# Patient Record
Sex: Male | Born: 1980 | Race: Black or African American | Hispanic: No | Marital: Single | Smoking: Current every day smoker
Health system: Southern US, Community
[De-identification: ages and names within clinical notes are randomized; demographics above are authoritative.]

## PROBLEM LIST (undated history)

## (undated) DIAGNOSIS — K5900 Constipation, unspecified: Secondary | ICD-10-CM

## (undated) DIAGNOSIS — N35919 Unspecified urethral stricture, male, unspecified site: Secondary | ICD-10-CM

## (undated) DIAGNOSIS — Z435 Encounter for attention to cystostomy: Secondary | ICD-10-CM

## (undated) DIAGNOSIS — Z978 Presence of other specified devices: Secondary | ICD-10-CM

## (undated) DIAGNOSIS — Z8619 Personal history of other infectious and parasitic diseases: Secondary | ICD-10-CM

## (undated) DIAGNOSIS — D649 Anemia, unspecified: Secondary | ICD-10-CM

---

## 2002-12-31 ENCOUNTER — Emergency Department (HOSPITAL_COMMUNITY): Admission: EM | Admit: 2002-12-31 | Discharge: 2002-12-31 | Payer: Self-pay | Admitting: Emergency Medicine

## 2002-12-31 ENCOUNTER — Encounter: Payer: Self-pay | Admitting: Emergency Medicine

## 2003-01-05 ENCOUNTER — Emergency Department (HOSPITAL_COMMUNITY): Admission: EM | Admit: 2003-01-05 | Discharge: 2003-01-05 | Payer: Self-pay | Admitting: Emergency Medicine

## 2004-02-14 ENCOUNTER — Emergency Department (HOSPITAL_COMMUNITY): Admission: EM | Admit: 2004-02-14 | Discharge: 2004-02-14 | Payer: Self-pay | Admitting: Emergency Medicine

## 2009-08-16 ENCOUNTER — Emergency Department (HOSPITAL_COMMUNITY): Admission: EM | Admit: 2009-08-16 | Discharge: 2009-08-16 | Payer: Self-pay | Admitting: Emergency Medicine

## 2009-10-08 ENCOUNTER — Emergency Department (HOSPITAL_COMMUNITY): Admission: EM | Admit: 2009-10-08 | Discharge: 2009-10-08 | Payer: Self-pay | Admitting: Emergency Medicine

## 2009-10-10 ENCOUNTER — Emergency Department (HOSPITAL_COMMUNITY): Admission: EM | Admit: 2009-10-10 | Discharge: 2009-10-10 | Payer: Self-pay | Admitting: Emergency Medicine

## 2009-10-19 ENCOUNTER — Emergency Department (HOSPITAL_COMMUNITY): Admission: EM | Admit: 2009-10-19 | Discharge: 2009-10-19 | Payer: Self-pay | Admitting: Emergency Medicine

## 2017-04-23 ENCOUNTER — Encounter (HOSPITAL_COMMUNITY): Payer: Self-pay | Admitting: Emergency Medicine

## 2017-04-23 ENCOUNTER — Other Ambulatory Visit: Payer: Self-pay

## 2017-04-23 DIAGNOSIS — Y999 Unspecified external cause status: Secondary | ICD-10-CM | POA: Insufficient documentation

## 2017-04-23 DIAGNOSIS — S025XXA Fracture of tooth (traumatic), initial encounter for closed fracture: Secondary | ICD-10-CM | POA: Insufficient documentation

## 2017-04-23 DIAGNOSIS — Y92019 Unspecified place in single-family (private) house as the place of occurrence of the external cause: Secondary | ICD-10-CM | POA: Insufficient documentation

## 2017-04-23 DIAGNOSIS — F17210 Nicotine dependence, cigarettes, uncomplicated: Secondary | ICD-10-CM | POA: Insufficient documentation

## 2017-04-23 DIAGNOSIS — S0181XA Laceration without foreign body of other part of head, initial encounter: Secondary | ICD-10-CM | POA: Insufficient documentation

## 2017-04-23 DIAGNOSIS — Y939 Activity, unspecified: Secondary | ICD-10-CM | POA: Insufficient documentation

## 2017-04-23 DIAGNOSIS — W010XXA Fall on same level from slipping, tripping and stumbling without subsequent striking against object, initial encounter: Secondary | ICD-10-CM | POA: Insufficient documentation

## 2017-04-23 LAB — CBC
HCT: 48.2 % (ref 39.0–52.0)
Hemoglobin: 16.2 g/dL (ref 13.0–17.0)
MCH: 32.2 pg (ref 26.0–34.0)
MCHC: 33.6 g/dL (ref 30.0–36.0)
MCV: 95.8 fL (ref 78.0–100.0)
Platelets: 276 10*3/uL (ref 150–400)
RBC: 5.03 MIL/uL (ref 4.22–5.81)
RDW: 13.5 % (ref 11.5–15.5)
WBC: 8.6 10*3/uL (ref 4.0–10.5)

## 2017-04-23 LAB — BASIC METABOLIC PANEL
ANION GAP: 12 (ref 5–15)
BUN: 10 mg/dL (ref 6–20)
CHLORIDE: 103 mmol/L (ref 101–111)
CO2: 24 mmol/L (ref 22–32)
Calcium: 9.2 mg/dL (ref 8.9–10.3)
Creatinine, Ser: 0.85 mg/dL (ref 0.61–1.24)
GFR calc non Af Amer: >60 mL/min (ref >60)
Glucose, Bld: 87 mg/dL (ref 65–99)
Potassium: 3.8 mmol/L (ref 3.5–5.1)
Sodium: 139 mmol/L (ref 135–145)

## 2017-04-23 LAB — URINALYSIS, ROUTINE W REFLEX MICROSCOPIC
Bilirubin Urine: NEGATIVE
GLUCOSE, UA: NEGATIVE mg/dL
Hgb urine dipstick: NEGATIVE
KETONES UR: NEGATIVE mg/dL
Nitrite: NEGATIVE
PROTEIN: NEGATIVE mg/dL
Specific Gravity, Urine: 1.028 (ref 1.005–1.030)
pH: 6 (ref 5.0–8.0)

## 2017-04-23 NOTE — ED Triage Notes (Signed)
Pt states he had a syncopal episode yesterday, states he does not remember what happened but that he woke up and had a chipped tooth and a laceration under his chin. approx 3.5in laceration present under chin bleeding since yesterday but controlled.

## 2017-04-23 NOTE — ED Triage Notes (Signed)
Also c/o of a burning sensation to his hands. Unknown origin.

## 2017-04-24 ENCOUNTER — Emergency Department (HOSPITAL_COMMUNITY)
Admission: EM | Admit: 2017-04-24 | Discharge: 2017-04-24 | Disposition: A | Discharge status: Home / Self Care | Payer: Self-pay | Attending: Emergency Medicine | Admitting: Emergency Medicine

## 2017-04-24 DIAGNOSIS — S0993XA Unspecified injury of face, initial encounter: Secondary | ICD-10-CM

## 2017-04-24 DIAGNOSIS — S0181XA Laceration without foreign body of other part of head, initial encounter: Secondary | ICD-10-CM

## 2017-04-24 DIAGNOSIS — R55 Syncope and collapse: Secondary | ICD-10-CM

## 2017-04-24 MED ORDER — LIDOCAINE-EPINEPHRINE (PF) 2 %-1:200000 IJ SOLN
10.0000 mL | Freq: Once | INTRAMUSCULAR | Status: AC
  Administered 2017-04-24: 10 mL via INTRADERMAL
  Filled 2017-04-24: qty 20

## 2017-04-24 NOTE — ED Provider Notes (Signed)
[facility] EMERGENCY DEPARTMENT Provider Note   CSN: [id number] Arrival date & time: 04/23/17  1921     History   Chief Complaint Chief Complaint  Patient presents with  . Loss of Consciousness  . Facial Injury    HPI Jason Oconnell is a 37 y.o. male.  HPI Jason Oconnell is a 37 y.o. male with no medical problems, presents to ED with complaint of syncopal episodes and laceration to the chin.  Patient states he was walking in his house last night, states he got up "too quickly" and was walking to the bathroom when he got dizzy, lightheaded, and fell forward striking his chin on the floor.  He reports witnessed loss of consciousness for several seconds by his girlfriend.  Patient reports he chipped several teeth, and sustained laceration to the chin.  Pressure was applied for bleeding.  He reports large amount of bleeding.  Patient waiting room for 10 hours, states he has been feeling well since the syncopal episode.  Denies any dizziness or lightheadedness.  Denies any headache.  No nausea or vomiting.  No changes in his vision.  No memory loss.  No confusion.  States tetanus is within the last 10 years.  History reviewed. No pertinent past medical history.  There are no active problems to display for this patient.   History reviewed. No pertinent surgical history.     Home Medications    Prior to Admission medications   Not on File    Family History No family history on file.  Social History Social History   Tobacco Use  . Smoking status: Current Every Day Smoker    Packs/day: 1.00    Types: Cigarettes  . Smokeless tobacco: Never Used  Substance Use Topics  . Alcohol use: No    Frequency: Never  . Drug use: Yes    Frequency: 5.0 times per week    Types: Marijuana     Allergies   Patient has no known allergies.   Review of Systems Review of Systems  Constitutional: Negative for chills and fever.  Respiratory: Negative for cough,  chest tightness and shortness of breath.   Cardiovascular: Negative for chest pain, palpitations and leg swelling.  Gastrointestinal: Negative for abdominal distention, abdominal pain, diarrhea, nausea and vomiting.  Genitourinary: Negative for dysuria, frequency, hematuria and urgency.  Musculoskeletal: Negative for arthralgias, myalgias, neck pain and neck stiffness.  Skin: Positive for wound. Negative for rash.  Allergic/Immunologic: Negative for immunocompromised state.  Neurological: Positive for syncope. Negative for dizziness, weakness, light-headedness, numbness and headaches.  All other systems reviewed and are negative.    Physical Exam Updated Vital Signs BP 139/75   Pulse (!) 54   Temp 98.6 F (37 C) (Oral)   Resp 16   Ht 6' (1.829 m)   Wt 90.7 kg (200 lb)   SpO2 100%   BMI 27.12 kg/m   Physical Exam  Constitutional: He is oriented to person, place, and time. He appears well-developed and well-nourished. No distress.  HENT:  Head: Normocephalic and atraumatic.  4 cm laceration to the chin, gaping.  Several chipped front teeth.  No tooth loosening.  No tenderness to palpation over mandible or maxilla bones.  No trismus  Eyes: Conjunctivae and EOM are normal. Pupils are equal, round, and reactive to light.  Neck: Neck supple.  No midline tenderness.  Cardiovascular: Normal rate, regular rhythm and normal heart sounds.  Pulmonary/Chest: Effort normal. No respiratory distress. He has no wheezes.  He has no rales.  Abdominal: Soft. Bowel sounds are normal. He exhibits no distension. There is no tenderness. There is no rebound.  Musculoskeletal: He exhibits no edema.  Neurological: He is alert and oriented to person, place, and time.  5/5 and equal upper and lower extremity strength bilaterally. Equal grip strength bilaterally. Normal finger to nose and heel to shin. No pronator drift. Gait is normal  Skin: Skin is warm and dry.  Nursing note and vitals  reviewed.    ED Treatments / Results  Labs (all labs ordered are listed, but only abnormal results are displayed) Labs Reviewed  URINALYSIS, ROUTINE W REFLEX MICROSCOPIC - Abnormal; Notable for the following components:      Result Value   APPearance HAZY (*)    Leukocytes, UA SMALL (*)    Bacteria, UA RARE (*)    Squamous Epithelial / LPF 0-5 (*)    All other components within normal limits  BASIC METABOLIC PANEL  CBC  CBG MONITORING, ED    EKG  EKG Interpretation  Date/Time:  Wednesday April 23 2017 21:01:56 EST Ventricular Rate:  59 PR Interval:  152 QRS Duration: 90 QT Interval:  392 QTC Calculation: 388 R Axis:   40 Text Interpretation:  Sinus bradycardia Nonspecific ST abnormality Abnormal ECG Confirmed by Tilden Fossaees, Elizabeth [phone]([id number]) on 04/24/2017 6:32:05 AM       Radiology No results found.  Procedures .Marland Kitchen.Laceration Repair Date/Time: 04/24/2017 7:49 AM Performed by: Jaynie CrumbleKirichenko, Kaelie Henigan, PA-C Authorized by: Jaynie CrumbleKirichenko, Terilyn Sano, PA-C   Consent:    Consent obtained:  Verbal   Consent given by:  Patient   Risks discussed:  Infection, pain, poor cosmetic result and need for additional repair   Alternatives discussed:  No treatment and delayed treatment Anesthesia (see MAR for exact dosages):    Anesthesia method:  Local infiltration   Local anesthetic:  Lidocaine 2% WITH epi Laceration details:    Location:  Face   Face location:  Chin   Length (cm):  4 Repair type:    Repair type:  Simple Exploration:    Wound exploration: wound explored through full range of motion   Treatment:    Area cleansed with:  Saline   Amount of cleaning:  Standard   Irrigation solution:  Sterile saline   Irrigation method:  Syringe Skin repair:    Repair method:  Sutures   Suture size:  6-0   Suture material:  Prolene   Suture technique:  Simple interrupted   Number of sutures:  7 Approximation:    Approximation:  Close   Vermilion border: well-aligned   Post-procedure  details:    Patient tolerance of procedure:  Tolerated well, no immediate complications   (including critical care time)  Medications Ordered in ED Medications  lidocaine-EPINEPHrine (XYLOCAINE W/EPI) 2 %-1:200000 (PF) injection 10 mL (not administered)     Initial Impression / Assessment and Plan / ED Course  I have reviewed the triage vital signs and the nursing notes.  Pertinent labs & imaging results that were available during my care of the patient were reviewed by me and considered in my medical decision making (see chart for details).     Pt in ED with a syncopal episode.  Syncopal episode after standing up quickly.  He has not had any dizziness or lightheadedness.  He has normal neurological exam.  He has no cervical spine tenderness.  No headache.  Doubt intracranial bleed or cervical spine injury.  No tenderness over facial bones.  He does  have several chipped teeth, he will follow-up with a dentist.  There is no tooth loosening.  Laceration was repaired with sutures.  Basic labs were obtained at triage and are all unremarkable.  EKG with no acute findings or concerning arrhythmias.  He is not orthostatic.  He is otherwise young and healthy.  His suture removal in 7 days.  Vitals:   04/23/17 2055 04/24/17 0223 04/24/17 0746  BP: (!) 152/90 139/75 (!) 135/92  Pulse: 72 (!) 54 71  Resp: 18 16 16   Temp: 98.6 F (37 C)    TempSrc: Oral    SpO2: 99% 100% 98%  Weight: 90.7 kg (200 lb)    Height: 6' (1.829 m)       Final Clinical Impressions(s) / ED Diagnoses   Final diagnoses:  Syncope, unspecified syncope type  Chin laceration, initial encounter  Dental injury, initial encounter    ED Discharge Orders    None       Jaynie Crumble, PA-C 04/24/17 0751    Tilden Fossa, MD 04/26/17 1427

## 2017-04-24 NOTE — Discharge Instructions (Signed)
Keep your laceration clean.  Please follow-up with a family doctor or return for suture removal in 7 days.  You can apply bacitracin topically.  Follow-up with a dentist regarding her teeth, I have provided you with a referral with Dr. Lucky CowboyKnox, but you must call today or tomorrow for an apt.  Return if any worsening symptoms, severe headache, vomiting, numbness or weakness in your hands, neck pain.

## 2017-04-24 NOTE — ED Notes (Signed)
PA took to room 13 for sutures.

## 2018-10-23 ENCOUNTER — Other Ambulatory Visit: Payer: Self-pay

## 2018-10-23 ENCOUNTER — Encounter (HOSPITAL_COMMUNITY): Payer: Self-pay | Admitting: Emergency Medicine

## 2018-10-23 ENCOUNTER — Inpatient Hospital Stay (HOSPITAL_COMMUNITY)
Admission: EM | Admit: 2018-10-23 | Discharge: 2018-10-28 | DRG: 854 | Disposition: A | Discharge status: Home / Self Care | Payer: Self-pay | Source: Ambulatory Visit | Attending: Urology | Admitting: Urology

## 2018-10-23 ENCOUNTER — Encounter (HOSPITAL_COMMUNITY): Payer: Self-pay | Admitting: *Deleted

## 2018-10-23 ENCOUNTER — Ambulatory Visit (HOSPITAL_COMMUNITY)
Admission: EM | Admit: 2018-10-23 | Discharge: 2018-10-23 | Disposition: A | Discharge status: Other Acute Inpatient Hospital | Payer: Self-pay | Attending: Family Medicine | Admitting: Family Medicine

## 2018-10-23 DIAGNOSIS — L03315 Cellulitis of perineum: Secondary | ICD-10-CM

## 2018-10-23 DIAGNOSIS — I96 Gangrene, not elsewhere classified: Secondary | ICD-10-CM | POA: Diagnosis present

## 2018-10-23 DIAGNOSIS — N340 Urethral abscess: Secondary | ICD-10-CM | POA: Diagnosis present

## 2018-10-23 DIAGNOSIS — N4821 Abscess of corpus cavernosum and penis: Secondary | ICD-10-CM | POA: Diagnosis present

## 2018-10-23 DIAGNOSIS — F17210 Nicotine dependence, cigarettes, uncomplicated: Secondary | ICD-10-CM | POA: Diagnosis present

## 2018-10-23 DIAGNOSIS — S3733XA Laceration of urethra, initial encounter: Secondary | ICD-10-CM | POA: Diagnosis not present

## 2018-10-23 DIAGNOSIS — R3911 Hesitancy of micturition: Secondary | ICD-10-CM | POA: Diagnosis present

## 2018-10-23 DIAGNOSIS — S358X8A Other specified injury of other blood vessels at abdomen, lower back and pelvis level, initial encounter: Secondary | ICD-10-CM | POA: Diagnosis not present

## 2018-10-23 DIAGNOSIS — N5082 Scrotal pain: Secondary | ICD-10-CM

## 2018-10-23 DIAGNOSIS — Y838 Other surgical procedures as the cause of abnormal reaction of the patient, or of later complication, without mention of misadventure at the time of the procedure: Secondary | ICD-10-CM | POA: Diagnosis not present

## 2018-10-23 DIAGNOSIS — I9742 Intraoperative hemorrhage and hematoma of a circulatory system organ or structure complicating other procedure: Secondary | ICD-10-CM | POA: Diagnosis not present

## 2018-10-23 DIAGNOSIS — N35114 Postinfective anterior urethral stricture, not elsewhere classified: Secondary | ICD-10-CM | POA: Diagnosis present

## 2018-10-23 DIAGNOSIS — A419 Sepsis, unspecified organism: Principal | ICD-10-CM | POA: Diagnosis present

## 2018-10-23 DIAGNOSIS — L02215 Cutaneous abscess of perineum: Secondary | ICD-10-CM | POA: Diagnosis present

## 2018-10-23 DIAGNOSIS — Z20828 Contact with and (suspected) exposure to other viral communicable diseases: Secondary | ICD-10-CM | POA: Diagnosis present

## 2018-10-23 LAB — COMPREHENSIVE METABOLIC PANEL
ALT: 39 U/L (ref 0–44)
AST: 36 U/L (ref 15–41)
Albumin: 3.2 g/dL — ABNORMAL LOW (ref 3.5–5.0)
Alkaline Phosphatase: 211 U/L — ABNORMAL HIGH (ref 38–126)
Anion gap: 13 (ref 5–15)
BUN: 19 mg/dL (ref 6–20)
CO2: 25 mmol/L (ref 22–32)
Calcium: 8.8 mg/dL — ABNORMAL LOW (ref 8.9–10.3)
Chloride: 90 mmol/L — ABNORMAL LOW (ref 98–111)
Creatinine, Ser: 1.27 mg/dL — ABNORMAL HIGH (ref 0.61–1.24)
GFR calc Af Amer: >60 mL/min (ref >60)
GFR calc non Af Amer: >60 mL/min (ref >60)
Glucose, Bld: 117 mg/dL — ABNORMAL HIGH (ref 70–99)
Potassium: 3.2 mmol/L — ABNORMAL LOW (ref 3.5–5.1)
Sodium: 128 mmol/L — ABNORMAL LOW (ref 135–145)
Total Bilirubin: 2 mg/dL — ABNORMAL HIGH (ref 0.3–1.2)
Total Protein: 9 g/dL — ABNORMAL HIGH (ref 6.5–8.1)

## 2018-10-23 LAB — CBC WITH DIFFERENTIAL/PLATELET
Abs Immature Granulocytes: 1.55 10*3/uL — ABNORMAL HIGH (ref 0.00–0.07)
Basophils Absolute: 0.1 10*3/uL (ref 0.0–0.1)
Basophils Relative: 0 %
Eosinophils Absolute: 0 10*3/uL (ref 0.0–0.5)
Eosinophils Relative: 0 %
HCT: 40.3 % (ref 39.0–52.0)
Hemoglobin: 14.8 g/dL (ref 13.0–17.0)
Immature Granulocytes: 4 %
Lymphocytes Relative: 8 %
Lymphs Abs: 3.2 10*3/uL (ref 0.7–4.0)
MCH: 31.2 pg (ref 26.0–34.0)
MCHC: 36.7 g/dL — ABNORMAL HIGH (ref 30.0–36.0)
MCV: 84.8 fL (ref 80.0–100.0)
Monocytes Absolute: 3.1 10*3/uL — ABNORMAL HIGH (ref 0.1–1.0)
Monocytes Relative: 8 %
Neutro Abs: 31.4 10*3/uL — ABNORMAL HIGH (ref 1.7–7.7)
Neutrophils Relative %: 80 %
Platelets: 370 10*3/uL (ref 150–400)
RBC: 4.75 MIL/uL (ref 4.22–5.81)
RDW: 13.4 % (ref 11.5–15.5)
WBC: 39.4 10*3/uL — ABNORMAL HIGH (ref 4.0–10.5)
nRBC: 0 % (ref 0.0–0.2)

## 2018-10-23 LAB — PROTIME-INR
INR: 1 (ref 0.8–1.2)
Prothrombin Time: 13.4 seconds (ref 11.4–15.2)

## 2018-10-23 LAB — URINALYSIS, ROUTINE W REFLEX MICROSCOPIC
Glucose, UA: NEGATIVE mg/dL
Ketones, ur: NEGATIVE mg/dL
Nitrite: NEGATIVE
Protein, ur: 100 mg/dL — AB
Specific Gravity, Urine: 1.025 (ref 1.005–1.030)
pH: 5 (ref 5.0–8.0)

## 2018-10-23 LAB — LACTIC ACID, PLASMA: Lactic Acid, Venous: 1.4 mmol/L (ref 0.5–1.9)

## 2018-10-23 MED ORDER — OXYCODONE-ACETAMINOPHEN 5-325 MG PO TABS
1.0000 | ORAL_TABLET | ORAL | Status: AC | PRN
  Administered 2018-10-23 – 2018-10-24 (×2): 1 via ORAL
  Filled 2018-10-23 (×2): qty 1

## 2018-10-23 NOTE — ED Triage Notes (Addendum)
Pt sent here from UC for rectal abscess, hematuria x 1 week.  Temp of 102.9 in triage.  Pt states decreased appetite.

## 2018-10-23 NOTE — Discharge Instructions (Addendum)
GO TO ER °

## 2018-10-23 NOTE — ED Triage Notes (Signed)
Pt sts some vomiting and diarrhea last week; pt sts now has swollen painful area to rectal area into where scrotum is; pt sts unable to sit without pain

## 2018-10-23 NOTE — ED Provider Notes (Signed)
[facility]    CSN: [id number] Arrival date & time: 10/23/18  1713      History   Chief Complaint Chief Complaint  Patient presents with  . Rectal Pain    HPI MYLAN SCHWARZ is a 38 y.o. male.   HPI Patient complains of severe pain in his scrotum and rectal area.  He states it swollen.  He is having difficulty urinating.  Pain with bowel movements.  Fever.  He states is gradually gotten worse over about a week. History reviewed. No pertinent past medical history.  There are no active problems to display for this patient.   History reviewed. No pertinent surgical history.     Home Medications    Prior to Admission medications   Not on File    Family History History reviewed. No pertinent family history.  Social History Social History   Tobacco Use  . Smoking status: Current Every Day Smoker    Packs/day: 1.00    Types: Cigarettes  . Smokeless tobacco: Never Used  Substance Use Topics  . Alcohol use: No    Frequency: Never  . Drug use: Yes    Frequency: 5.0 times per week    Types: Marijuana     Allergies   Patient has no known allergies.   Review of Systems Review of Systems  Constitutional: Negative for chills and fever.  HENT: Negative for ear pain and sore throat.   Eyes: Negative for pain and visual disturbance.  Respiratory: Negative for cough and shortness of breath.   Cardiovascular: Negative for chest pain and palpitations.  Gastrointestinal: Positive for rectal pain. Negative for abdominal pain and vomiting.  Genitourinary: Positive for difficulty urinating, dysuria, hematuria, scrotal swelling and testicular pain.  Musculoskeletal: Negative for arthralgias and back pain.  Skin: Negative for color change and rash.  Neurological: Negative for seizures and syncope.  All other systems reviewed and are negative.    Physical Exam Triage Vital Signs ED Triage Vitals  Enc Vitals Group     BP 10/23/18 1738 (!) 145/75   Pulse Rate 10/23/18 1738 (!) 113     Resp 10/23/18 1738 18     Temp 10/23/18 1738 (!) 97.3 F (36.3 C)     Temp Source 10/23/18 1738 Temporal     SpO2 10/23/18 1738 98 %     Weight --      Height --      Head Circumference --      Peak Flow --      Pain Score 10/23/18 1739 10     Pain Loc --      Pain Edu? --      Excl. in [place]? --    No data found.  Updated Vital Signs BP (!) 145/75 (BP Location: Left Arm)   Pulse (!) 113   Temp (!) 97.3 F (36.3 C) (Temporal)   Resp 18   SpO2 98%      Physical Exam Constitutional:      General: He is in acute distress.     Appearance: He is well-developed. He is ill-appearing.     Comments: Patient is walking with small steps.  Bent at the waist.  Appears acutely uncomfortable  HENT:     Head: Normocephalic and atraumatic.  Eyes:     Conjunctiva/sclera: Conjunctivae normal.     Pupils: Pupils are equal, round, and reactive to light.  Neck:     Musculoskeletal: Normal range of motion.  Cardiovascular:  Rate and Rhythm: Normal rate.  Pulmonary:     Effort: Pulmonary effort is normal. No respiratory distress.  Abdominal:     General: There is no distension.     Palpations: Abdomen is soft.  Genitourinary:   Musculoskeletal: Normal range of motion.  Skin:    General: Skin is warm and dry.  Neurological:     Mental Status: He is alert.      UC Treatments / Results  Labs (all labs ordered are listed, but only abnormal results are displayed) Labs Reviewed - No data to display  EKG   Radiology No results found.  Procedures Procedures (including critical care time)  Medications Ordered in UC Medications - No data to display  Initial Impression / Assessment and Plan / UC Course  I have reviewed the triage vital signs and the nursing notes.  Pertinent labs & imaging results that were available during my care of the patient were reviewed by me and considered in my medical decision making (see chart for details).      After visual examination of the extent of this patient's infection it was determined that he needed a higher level of care.  He was sent directly to the emergency room. Final Clinical Impressions(s) / UC Diagnoses   Final diagnoses:  Cellulitis of perineum  Scrotal pain     Discharge Instructions     GO TO ER   ED Prescriptions    None     Controlled Substance Prescriptions [place] Controlled Substance Registry consulted? Not Applicable   Eustace MooreNelson, Mattilynn Forrer Sue, MD 10/23/18 2001

## 2018-10-23 NOTE — ED Notes (Signed)
Patient is being discharged from the Urgent [facility] and sent to the Emergency Department via wheelchair by staff. Per SN, patient is stable but in need of higher level of care due to needing further eval for cellulitis to groin area. Patient is aware and verbalizes understanding of plan of care.  Vitals:   10/23/18 1738  BP: (!) 145/75  Pulse: (!) 113  Resp: 18  Temp: (!) 97.3 F (36.3 C)  SpO2: 98%

## 2018-10-24 ENCOUNTER — Observation Stay (HOSPITAL_COMMUNITY): Payer: Self-pay | Admitting: Certified Registered Nurse Anesthetist

## 2018-10-24 ENCOUNTER — Encounter (HOSPITAL_COMMUNITY): Payer: Self-pay | Admitting: *Deleted

## 2018-10-24 ENCOUNTER — Emergency Department (HOSPITAL_COMMUNITY): Payer: Self-pay

## 2018-10-24 ENCOUNTER — Encounter (HOSPITAL_COMMUNITY)
Admission: EM | Disposition: A | Discharge status: Home / Self Care | Payer: Self-pay | Source: Ambulatory Visit | Attending: Urology

## 2018-10-24 DIAGNOSIS — N4821 Abscess of corpus cavernosum and penis: Secondary | ICD-10-CM | POA: Diagnosis present

## 2018-10-24 HISTORY — PX: CYSTOSCOPY WITH URETEROSCOPY: SHX5123

## 2018-10-24 LAB — SARS CORONAVIRUS 2 BY RT PCR (HOSPITAL ORDER, PERFORMED IN ~~LOC~~ HOSPITAL LAB): SARS Coronavirus 2: NEGATIVE

## 2018-10-24 LAB — POCT I-STAT 4, (NA,K, GLUC, HGB,HCT)
Glucose, Bld: 108 mg/dL — ABNORMAL HIGH (ref 70–99)
HCT: 30 % — ABNORMAL LOW (ref 39.0–52.0)
Hemoglobin: 10.2 g/dL — ABNORMAL LOW (ref 13.0–17.0)
Potassium: 4.5 mmol/L (ref 3.5–5.1)
Sodium: 135 mmol/L (ref 135–145)

## 2018-10-24 LAB — LACTIC ACID, PLASMA: Lactic Acid, Venous: 2.1 mmol/L (ref 0.5–1.9)

## 2018-10-24 SURGERY — CYSTOSCOPY WITH URETEROSCOPY
Anesthesia: General | Site: Perineum

## 2018-10-24 MED ORDER — SODIUM CHLORIDE 0.9 % IR SOLN
Status: DC | PRN
  Administered 2018-10-24: 3000 mL

## 2018-10-24 MED ORDER — FENTANYL CITRATE (PF) 100 MCG/2ML IJ SOLN
INTRAMUSCULAR | Status: AC
  Filled 2018-10-24: qty 2

## 2018-10-24 MED ORDER — BUPIVACAINE HCL (PF) 0.5 % IJ SOLN
INTRAMUSCULAR | Status: AC
  Filled 2018-10-24: qty 30

## 2018-10-24 MED ORDER — ONDANSETRON HCL 4 MG/2ML IJ SOLN
4.0000 mg | INTRAMUSCULAR | Status: DC | PRN
  Administered 2018-10-24 – 2018-10-25 (×2): 4 mg via INTRAVENOUS

## 2018-10-24 MED ORDER — METRONIDAZOLE IN NACL 5-0.79 MG/ML-% IV SOLN
500.0000 mg | Freq: Once | INTRAVENOUS | Status: AC
  Administered 2018-10-24: 01:00:00 500 mg via INTRAVENOUS
  Filled 2018-10-24: qty 100

## 2018-10-24 MED ORDER — LIDOCAINE 2% (20 MG/ML) 5 ML SYRINGE
INTRAMUSCULAR | Status: AC
  Filled 2018-10-24: qty 5

## 2018-10-24 MED ORDER — FENTANYL CITRATE (PF) 100 MCG/2ML IJ SOLN
INTRAMUSCULAR | Status: DC | PRN
  Administered 2018-10-24 (×12): 50 ug via INTRAVENOUS

## 2018-10-24 MED ORDER — HYDROMORPHONE HCL 1 MG/ML IJ SOLN
INTRAMUSCULAR | Status: AC
  Filled 2018-10-24: qty 1

## 2018-10-24 MED ORDER — VANCOMYCIN HCL 10 G IV SOLR
1500.0000 mg | Freq: Once | INTRAVENOUS | Status: AC
  Administered 2018-10-24: 1500 mg via INTRAVENOUS
  Filled 2018-10-24: qty 1500

## 2018-10-24 MED ORDER — MIDAZOLAM HCL 5 MG/5ML IJ SOLN
INTRAMUSCULAR | Status: DC | PRN
  Administered 2018-10-24: 2 mg via INTRAVENOUS

## 2018-10-24 MED ORDER — ACETAMINOPHEN 325 MG PO TABS
650.0000 mg | ORAL_TABLET | ORAL | Status: DC | PRN
  Administered 2018-10-26: 650 mg via ORAL
  Filled 2018-10-24: qty 2

## 2018-10-24 MED ORDER — MIDAZOLAM HCL 2 MG/2ML IJ SOLN
INTRAMUSCULAR | Status: AC
  Filled 2018-10-24: qty 2

## 2018-10-24 MED ORDER — BUPIVACAINE HCL (PF) 0.5 % IJ SOLN
INTRAMUSCULAR | Status: DC | PRN
  Administered 2018-10-24: 30 mL

## 2018-10-24 MED ORDER — MORPHINE SULFATE (PF) 2 MG/ML IV SOLN
2.0000 mg | INTRAVENOUS | Status: DC | PRN
  Administered 2018-10-24 (×2): 2 mg via INTRAVENOUS
  Administered 2018-10-24 – 2018-10-25 (×5): 4 mg via INTRAVENOUS
  Administered 2018-10-25: 2 mg via INTRAVENOUS
  Administered 2018-10-25 – 2018-10-26 (×4): 4 mg via INTRAVENOUS
  Administered 2018-10-26 – 2018-10-27 (×2): 2 mg via INTRAVENOUS
  Administered 2018-10-27: 4 mg via INTRAVENOUS
  Filled 2018-10-24: qty 2
  Filled 2018-10-24: qty 1
  Filled 2018-10-24 (×2): qty 2
  Filled 2018-10-24: qty 1
  Filled 2018-10-24 (×3): qty 2
  Filled 2018-10-24: qty 1
  Filled 2018-10-24: qty 2
  Filled 2018-10-24: qty 1
  Filled 2018-10-24 (×2): qty 2
  Filled 2018-10-24: qty 1
  Filled 2018-10-24: qty 2
  Filled 2018-10-24: qty 1
  Filled 2018-10-24: qty 2

## 2018-10-24 MED ORDER — VANCOMYCIN HCL IN DEXTROSE 1-5 GM/200ML-% IV SOLN: 1000.0000 mg | Freq: Once | INTRAVENOUS | Status: DC

## 2018-10-24 MED ORDER — DOCUSATE SODIUM 100 MG PO CAPS
100.0000 mg | ORAL_CAPSULE | Freq: Two times a day (BID) | ORAL | Status: DC
  Administered 2018-10-24 – 2018-10-28 (×6): 100 mg via ORAL
  Filled 2018-10-24 (×7): qty 1

## 2018-10-24 MED ORDER — LACTATED RINGERS IV BOLUS (SEPSIS)
500.0000 mL | Freq: Once | INTRAVENOUS | Status: AC
  Administered 2018-10-24: 05:00:00 500 mL via INTRAVENOUS

## 2018-10-24 MED ORDER — SODIUM CHLORIDE 0.9 % IV SOLN
2.0000 g | Freq: Once | INTRAVENOUS | Status: AC
  Administered 2018-10-24: 2 g via INTRAVENOUS
  Filled 2018-10-24: qty 2

## 2018-10-24 MED ORDER — HYDROMORPHONE HCL 1 MG/ML IJ SOLN
0.2500 mg | INTRAMUSCULAR | Status: DC | PRN
  Administered 2018-10-24: 0.25 mg via INTRAVENOUS

## 2018-10-24 MED ORDER — OXYCODONE HCL 5 MG PO TABS
5.0000 mg | ORAL_TABLET | ORAL | Status: DC | PRN
  Administered 2018-10-24 – 2018-10-28 (×10): 5 mg via ORAL
  Filled 2018-10-24 (×10): qty 1

## 2018-10-24 MED ORDER — PROPOFOL 10 MG/ML IV BOLUS
INTRAVENOUS | Status: AC
  Filled 2018-10-24: qty 20

## 2018-10-24 MED ORDER — DEXAMETHASONE SODIUM PHOSPHATE 4 MG/ML IJ SOLN
INTRAMUSCULAR | Status: DC | PRN
  Administered 2018-10-24: 10 mg via INTRAVENOUS

## 2018-10-24 MED ORDER — LACTATED RINGERS IV BOLUS (SEPSIS)
1000.0000 mL | Freq: Once | INTRAVENOUS | Status: AC
  Administered 2018-10-24: 1000 mL via INTRAVENOUS

## 2018-10-24 MED ORDER — SODIUM CHLORIDE 0.9 % IV SOLN
2.0000 g | Freq: Three times a day (TID) | INTRAVENOUS | Status: DC
  Administered 2018-10-24 – 2018-10-26 (×6): 2 g via INTRAVENOUS
  Filled 2018-10-24 (×7): qty 2

## 2018-10-24 MED ORDER — LACTATED RINGERS IV SOLN
INTRAVENOUS | Status: DC
  Administered 2018-10-24 – 2018-10-27 (×12): via INTRAVENOUS

## 2018-10-24 MED ORDER — INDIGOTINDISULFONATE SODIUM 8 MG/ML IJ SOLN
INTRAMUSCULAR | Status: AC
  Filled 2018-10-24: qty 5

## 2018-10-24 MED ORDER — PROPOFOL 10 MG/ML IV BOLUS
INTRAVENOUS | Status: DC | PRN
  Administered 2018-10-24: 180 mg via INTRAVENOUS

## 2018-10-24 MED ORDER — HYDROMORPHONE HCL 1 MG/ML IJ SOLN
INTRAMUSCULAR | Status: DC | PRN
  Administered 2018-10-24 (×2): 1 mg via INTRAVENOUS

## 2018-10-24 MED ORDER — FENTANYL CITRATE (PF) 100 MCG/2ML IJ SOLN
75.0000 ug | Freq: Once | INTRAMUSCULAR | Status: AC
  Administered 2018-10-24: 75 ug via INTRAVENOUS
  Filled 2018-10-24: qty 2

## 2018-10-24 MED ORDER — INDIGOTINDISULFONATE SODIUM 8 MG/ML IJ SOLN
INTRAMUSCULAR | Status: DC | PRN
  Administered 2018-10-24: 5 mL via INTRAVENOUS

## 2018-10-24 MED ORDER — LACTATED RINGERS IV BOLUS (SEPSIS)
1000.0000 mL | Freq: Once | INTRAVENOUS | Status: AC
  Administered 2018-10-24: 04:00:00 1000 mL via INTRAVENOUS

## 2018-10-24 MED ORDER — ONDANSETRON HCL 4 MG/2ML IJ SOLN
INTRAMUSCULAR | Status: AC
  Filled 2018-10-24: qty 2

## 2018-10-24 MED ORDER — VANCOMYCIN HCL IN DEXTROSE 750-5 MG/150ML-% IV SOLN
750.0000 mg | Freq: Three times a day (TID) | INTRAVENOUS | Status: DC
  Administered 2018-10-24 – 2018-10-25 (×6): 750 mg via INTRAVENOUS
  Filled 2018-10-24 (×8): qty 150

## 2018-10-24 MED ORDER — HYDROMORPHONE HCL 2 MG/ML IJ SOLN
INTRAMUSCULAR | Status: AC
  Filled 2018-10-24: qty 1

## 2018-10-24 MED ORDER — DEXAMETHASONE SODIUM PHOSPHATE 10 MG/ML IJ SOLN
INTRAMUSCULAR | Status: AC
  Filled 2018-10-24: qty 1

## 2018-10-24 MED ORDER — LIDOCAINE 2% (20 MG/ML) 5 ML SYRINGE
INTRAMUSCULAR | Status: DC | PRN
  Administered 2018-10-24: 60 mg via INTRAVENOUS

## 2018-10-24 MED ORDER — IOHEXOL 300 MG/ML  SOLN
100.0000 mL | Freq: Once | INTRAMUSCULAR | Status: AC | PRN
  Administered 2018-10-24: 03:00:00 100 mL via INTRAVENOUS

## 2018-10-24 SURGICAL SUPPLY — 36 items
BAG URINE DRAINAGE (UROLOGICAL SUPPLIES) ×3 IMPLANT
BAG URINE LEG 500ML (DRAIN) IMPLANT
BLADE SURG 15 STRL LF DISP TIS (BLADE) ×1 IMPLANT
BLADE SURG 15 STRL SS (BLADE) ×2
CATH FOLEY 2W COUNCIL 5CC 18FR (CATHETERS) ×9 IMPLANT
CATH FOLEY 2WAY 5CC 16FR (CATHETERS) ×2
CATH FOLEY INTRO SUPRA 16F (CATHETERS) ×3 IMPLANT
CATH URTH STD 16FR FL 2W DRN (CATHETERS) ×1 IMPLANT
COUNTER NEEDLE 20 DBL MAG RED (NEEDLE) ×3 IMPLANT
COVER SURGICAL LIGHT HANDLE (MISCELLANEOUS) ×3 IMPLANT
COVER WAND RF STERILE (DRAPES) IMPLANT
ELECT PENCIL ROCKER SW 15FT (MISCELLANEOUS) ×3 IMPLANT
ELECT REM PT RETURN 15FT ADLT (MISCELLANEOUS) ×3 IMPLANT
GAUZE 4X4 16PLY RFD (DISPOSABLE) ×3 IMPLANT
GLOVE BIOGEL M STRL SZ7.5 (GLOVE) ×3 IMPLANT
GOWN STRL REUS W/TWL XL LVL3 (GOWN DISPOSABLE) ×6 IMPLANT
HEMOSTAT SURGICEL 2X14 (HEMOSTASIS) ×6 IMPLANT
KIT TURNOVER KIT A (KITS) IMPLANT
MANIFOLD NEPTUNE II (INSTRUMENTS) ×3 IMPLANT
NEEDLE HYPO 22GX1.5 SAFETY (NEEDLE) IMPLANT
NS IRRIG 1000ML POUR BTL (IV SOLUTION) ×3 IMPLANT
PACK CYSTO (CUSTOM PROCEDURE TRAY) ×3 IMPLANT
PAK SCROTO (SET/KITS/TRAYS/PACK) ×3 IMPLANT
PLUG CATH AND CAP STER (CATHETERS) IMPLANT
SPONGE DRAIN TRACH 4X4 STRL 2S (GAUZE/BANDAGES/DRESSINGS) ×3 IMPLANT
SUT SILK 2 0 SH (SUTURE) IMPLANT
SUT VIC AB 2-0 SH 27 (SUTURE) ×12
SUT VIC AB 2-0 SH 27X BRD (SUTURE) ×6 IMPLANT
SUT VIC AB 2-0 UR6 27 (SUTURE) ×12 IMPLANT
SWAB COLLECTION DEVICE MRSA (MISCELLANEOUS) ×3 IMPLANT
SWAB CULTURE ESWAB REG 1ML (MISCELLANEOUS) ×3 IMPLANT
SYR 10ML LL (SYRINGE) IMPLANT
SYR 20ML LL LF (SYRINGE) IMPLANT
TAPE CLOTH SURG 4X10 WHT LF (GAUZE/BANDAGES/DRESSINGS) ×3 IMPLANT
TOWEL OR 17X26 10 PK STRL BLUE (TOWEL DISPOSABLE) ×3 IMPLANT
WATER STERILE IRR 3000ML UROMA (IV SOLUTION) IMPLANT

## 2018-10-24 NOTE — Anesthesia Preprocedure Evaluation (Addendum)
Anesthesia Evaluation  Patient identified by MRN, date of birth, ID band Patient awake    Reviewed: Allergy & Precautions, NPO status , Patient's Chart, lab work & pertinent test results  Airway Mallampati: II  TM Distance: >3 FB Neck ROM: Full    Dental no notable dental hx. (+) Teeth Intact   Pulmonary Current Smoker,    Pulmonary exam normal breath sounds clear to auscultation       Cardiovascular Exercise Tolerance: Good negative cardio ROS Normal cardiovascular exam Rhythm:Regular Rate:Normal     Neuro/Psych negative neurological ROS  negative psych ROS   GI/Hepatic   Endo/Other  negative endocrine ROS  Renal/GU      Musculoskeletal   Abdominal   Peds  Hematology negative hematology ROS (+)   Anesthesia Other Findings   Reproductive/Obstetrics                            Anesthesia Physical Anesthesia Plan  ASA: II and emergent  Anesthesia Plan: General   Post-op Pain Management:    Induction: Intravenous  PONV Risk Score and Plan: 2 and Treatment may vary due to age or medical condition and Ondansetron  Airway Management Planned: LMA  Additional Equipment:   Intra-op Plan:   Post-operative Plan:   Informed Consent: I have reviewed the patients History and Physical, chart, labs and discussed the procedure including the risks, benefits and alternatives for the proposed anesthesia with the patient or authorized representative who has indicated his/her understanding and acceptance.     Dental advisory given  Plan Discussed with:   Anesthesia Plan Comments:        Anesthesia Quick Evaluation

## 2018-10-24 NOTE — Anesthesia Preprocedure Evaluation (Signed)
Anesthesia Evaluation  Patient identified by MRN, date of birth, ID band Patient awake    Reviewed: Allergy & Precautions, NPO status , Patient's Chart, lab work & pertinent test results  Airway Mallampati: II  TM Distance: >3 FB     Dental   Pulmonary Current Smoker,    breath sounds clear to auscultation       Cardiovascular negative cardio ROS   Rhythm:Regular Rate:Normal     Neuro/Psych    GI/Hepatic negative GI ROS, Neg liver ROS,   Endo/Other    Renal/GU negative Renal ROS     Musculoskeletal   Abdominal   Peds  Hematology   Anesthesia Other Findings   Reproductive/Obstetrics                             Anesthesia Physical Anesthesia Plan  ASA: II  Anesthesia Plan: General   Post-op Pain Management:    Induction: Intravenous  PONV Risk Score and Plan: 1 and Ondansetron, Dexamethasone and Midazolam  Airway Management Planned: LMA  Additional Equipment:   Intra-op Plan:   Post-operative Plan:   Informed Consent: I have reviewed the patients History and Physical, chart, labs and discussed the procedure including the risks, benefits and alternatives for the proposed anesthesia with the patient or authorized representative who has indicated his/her understanding and acceptance.     Dental advisory given  Plan Discussed with: CRNA and Anesthesiologist  Anesthesia Plan Comments:         Anesthesia Quick Evaluation

## 2018-10-24 NOTE — Brief Op Note (Signed)
10/23/2018 - 10/24/2018  12:03 PM  PATIENT:  Jason Oconnell  38 y.o. male  PRE-OPERATIVE DIAGNOSIS:  penile abscess  POST-OPERATIVE DIAGNOSIS:  penile abcess  PROCEDURE:  Procedure(s): CYSTOSCOPY suprapubic tube placement perineal exploration (N/A)  SURGEON:  Surgeon(s) and Role:    * Winter, Conception Oms, MD - Primary    * McKenzie, Candee Furbish, MD - Assisting   PHYSICIAN ASSISTANT:   ASSISTANTS: Tharon Aquas, MD PGY4  ANESTHESIA: General  EBL:  1075 mL   BLOOD ADMINISTERED:None  DRAINS: Urinary Catheter (Foley) and Urinary Catheter (Suprapubic)   LOCAL MEDICATIONS USED:  MARCAINE     SPECIMEN:  Lavage/Washing  DISPOSITION OF SPECIMEN:  PATHOLOGY  COUNTS:  YES  TOURNIQUET:  None  DICTATION: .Note written in EPIC  PLAN OF CARE: Admit to inpatient   PATIENT DISPOSITION:  PACU - hemodynamically stable.

## 2018-10-24 NOTE — Anesthesia Procedure Notes (Signed)
Procedure Name: LMA Insertion Date/Time: 10/24/2018 8:10 AM Performed by: Claudia Desanctis, CRNA Pre-anesthesia Checklist: Emergency Drugs available, Patient identified, Suction available and Patient being monitored Patient Re-evaluated:Patient Re-evaluated prior to induction Oxygen Delivery Method: Circle system utilized Preoxygenation: Pre-oxygenation with 100% oxygen Induction Type: IV induction Ventilation: Mask ventilation without difficulty LMA: LMA inserted LMA Size: 4.0 Number of attempts: 1 Placement Confirmation: positive ETCO2 and breath sounds checked- equal and bilateral Tube secured with: Tape Dental Injury: Teeth and Oropharynx as per pre-operative assessment

## 2018-10-24 NOTE — Transfer of Care (Signed)
Immediate Anesthesia Transfer of Care Note  Patient: Jason Oconnell  Procedure(s) Performed: CYSTOSCOPY suprapubic tube placement perineal exploration (N/A Perineum)  Patient Location: PACU  Anesthesia Type:General  Level of Consciousness: awake, alert , oriented and patient cooperative  Airway & Oxygen Therapy: Patient Spontanous Breathing and Patient connected to face mask  Post-op Assessment: Report given to RN and Post -op Vital signs reviewed and stable  Post vital signs: Reviewed and stable  Last Vitals:  Vitals Value Taken Time  BP 152/85 10/24/18 1211  Temp    Pulse 111 10/24/18 1213  Resp 19 10/24/18 1213  SpO2 98 % 10/24/18 1213  Vitals shown include unvalidated device data.  Last Pain:  Vitals:   10/24/18 0700  TempSrc:   PainSc: 10-Worst pain ever         Complications: No apparent anesthesia complications

## 2018-10-24 NOTE — Anesthesia Postprocedure Evaluation (Signed)
Anesthesia Post Note  Patient: Jason Oconnell  Procedure(s) Performed: CYSTOSCOPY suprapubic tube placement perineal exploration (N/A Perineum)     Patient location during evaluation: PACU Anesthesia Type: General Level of consciousness: awake Pain management: pain level controlled Vital Signs Assessment: post-procedure vital signs reviewed and stable Respiratory status: spontaneous breathing Cardiovascular status: stable Postop Assessment: no apparent nausea or vomiting Anesthetic complications: no    Last Vitals:  Vitals:   10/24/18 1245 10/24/18 1300  BP: 138/81 135/81  Pulse: 99 93  Resp: 16 19  Temp:  36.7 C  SpO2: 99% 100%    Last Pain:  Vitals:   10/24/18 1245  TempSrc:   PainSc: Asleep                 Greta Yung

## 2018-10-24 NOTE — Op Note (Addendum)
Operative Note  Preoperative diagnosis:  1.  9 cm periurethral abscess 2.  Sepsis  Postoperative diagnosis: 1.  9 cm periurethral abscess 2.  Sepsis   Procedure(s): 1.  Perineal exploration with incision and drainage of perineal/periurethral abscess 2.  Cystoscopy 3.  Suprapubic catheter placement  Surgeon: Ellison Hughs, MD  Assistants: Nicolette Bang, MD Tharon Aquas, MD PGY 4  Anesthesia:  General LMA  Complications: 1.  Following incision and drainage of the periurethral abscess there was extensive bleeding in the left inferolateral portion of the perineum that was extremely difficult to control despite multiple attempts at suture ligation and direct pressure to the area.  Hemostasis was eventually achieved.  EBL: 1000 mL  Specimens: 1.  Culture swabs  Drains/Catheters: 1.  18 French Foley catheter 2.  16 French supra pubic catheter  Intraoperative findings:   1. 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  During cystoscopic evaluation of the urethra with irrigation, the scrotum began to progressively enlarge suggesting communication between the urethra and the abscess cavity/scrotum.   2.   Large periurethral abscess with extensive involvement and necrosis of the corpus spongiosum         and urethra 3.   Following drainage of the periurethral abscess, the patient was found to have a 1 cm defect in           the left lateral aspect of the proximal anterior urethra as well as a more distal 2 cm defect in the         left lateral aspect of the anterior urethra.  Indication:  Jason Oconnell is a 38 y.o. male with sepsis due to a 9 cm periurethral abscess.  After informed consent was obtained, the patient was taken to the operating room for the above procedures.   Description of procedure:  After informed consent was obtained, the patient was brought to the operating room and general LMA anesthesia was  administered.  The patient was placed in the dorsolithotomy position and prepped and draped in usual sterile fashion.  A timeout was then performed.  A 21 French rigid cystoscope was then inserted into the urethral meatus and advanced down the urethra until a soft urethral stricture was identified.  The urethra mucosa also appeared pale, friable and necrotic.  A Glidewire was advanced through the aperture of the urethral stricture and into the bladder.  A 16 French flexible cystoscope was then advanced over the wire and into the bladder.  A complete bladder survey revealed no intravesical pathology.  The Glidewire was then replaced through the flexible scope and left in the bladder.  The flexible scope was then removed, leaving the wire in place.  An 55 [place] council tip catheter was then placed over the wire and into the bladder with return of clear yellow urine.  We then made a 4 cm vertical incision along the median raphae of the perineum.  The periurethral abscess could be easily palpated and was incised with a 15 blade scalpel with immediate drainage of grossly purulent fluid.  We then extensively irrigated the abscess cavity and suctioned the remaining debris out of it.  Blunt dissection was then performed to release the extensive loculations that were palpated throughout the 9 cm abscess cavity.  During blunt dissection and lysis of loculations, there was extensive bleeding was seen coming from the left inferior lateral aspects of the perineal incision, likely due to avulsion of the bulbar artery.  I  then extended his incision both inferiorly and superiorly and placed a [place] retractor to allow for adequate exposure of the surgical field.  Multiple figure-of-eight sutures were then passed to in an attempt to obtain hemostasis, those eventually achieved with packing the wound with Surgicel and 4 x 4 gauze and holding pressure for approximately 20 minutes.    Inspection of the corpus spongiosum and  urethra revealed a 2 cm left lateral defect involving the anterior urethra as well as another more proximal 1 cm left lateral defect.   I considered repairing these defects, but the surrounding urethral mucosa and spongiosum was too friable and necrotic to achieve an adequate closure.  Given the extent of the urethral injuries and the lack of adequate tissue integrity from the widespread infection, the decision to place a suprapubic catheter in hopes of diverting urine away from the urethral injuries.  We then packed the perineal wound with Kerlix gauze and reapproximated his perineal incision with interrupted 0 silk sutures.  A Glidewire was then placed through his council tip catheter and into the bladder.  His Foley catheter was then removed, leaving the wire in place.  A flexible cystoscope was then advanced over the wire and into the bladder.  We then measured approximately 2 fingerbreadths superior to the pubic symphysis and a spinal needle was inserted to assure intravesical placement within the bladder dome.  A 2 cm midline, suprapubic incision was then made.  A one-step percutaneous introducer was then advanced along the same tract that the spinal needle was placed and into the bladder, under direct vision.  A 16 French catheter was then placed through the trocar of the one-step introducer and into the bladder under direct vision.  The suprapubic catheter was then sutured in place using a 0 silk.  The patient was then awoken from general anesthesia and was transferred to the postanesthesia in stable condition.  Plan: Keep his urethral catheter plugged and suprapubic catheter to drainage.  Monitor on the floor.  Continue broad spectrum antibiotics.  Plan for repeat wound exploration and dressing change tomorrow.

## 2018-10-24 NOTE — ED Notes (Signed)
Patient transported to CT 

## 2018-10-24 NOTE — H&P (Signed)
Urology H+P Note   Admitting Attending: Winter  Chief Complaint: Penoscrotal abscess  HPI: Jason Oconnell is a 38 y.o. male with no significant past medical history who presents to the emergency room for fever and perineal pain.  Pain started approximately 1 week ago.  Then symptoms worsened including pain, fever, malaise, decreased p.o. intake.  Presented to the [facility] emergency room.  After a long period in the waiting room he was then found to be febrile to 102.9, tachycardic to 106.  CT scan was performed which showed 4 x 4 x 9 cm abscess along the inferior aspect of the proximal penis.  Sepsis protocol with cefepime, vancomycin Flagyl, fluid given at [facility].  He was transferred to [facility] long for urologic evaluation.  Upon evaluation he was afebrile, normotensive and his heart rate had stabilized.  On exam he was exquisitely tender near the base of the penis, scrotum, and perineal area.  He has been voiding volitionally with clear urine.  Urinalysis with moderate leukocyte esterase, few bacteria. Lactate 2.1.  WBC count 39.4.  Denies any other medical comorbidities and specifically denies immunosuppression, HIV, drug use.  Voids volitionally at baseline and has no prior urologic history   Past Medical History: History reviewed. No pertinent past medical history.  Past Surgical History:  History reviewed. No pertinent surgical history.  Medication: Current Facility-Administered Medications  Medication Dose Route Frequency Provider Last Rate Last Dose  . ceFEPIme (MAXIPIME) 2 g in sodium chloride 0.9 % 100 mL IVPB  2 g Intravenous N8G Delora Fuel, MD      . vancomycin (VANCOCIN) IVPB 750 mg/150 ml premix  750 mg Intravenous N5A Delora Fuel, MD       No current outpatient medications on file.    Allergies: No Known Allergies  Social History: Social History   Tobacco Use  . Smoking status: Current Every Day Smoker    Packs/day: 1.00    Types: Cigarettes  .  Smokeless tobacco: Never Used  Substance Use Topics  . Alcohol use: No    Frequency: Never  . Drug use: Yes    Frequency: 5.0 times per week    Types: Marijuana    Family History No family history on file.  Review of Systems 10 systems were reviewed and are negative except as noted specifically in the HPI.  Objective   Vital signs in last 24 hours: BP (!) 144/74   Pulse 91   Temp 98.9 F (37.2 C) (Oral)   Resp 17   Ht 6' (1.829 m)   Wt 81.6 kg   SpO2 100%   BMI 24.41 kg/m   Physical Exam General Appearance: Healthy appearing male, not overweight, uncomfortable Pulmonary: Normal respiratory effort on room air Cardiovascular: Regular rate Abdomen: Soft, non-tender, without masses. Musculoskeletal: Extremities without edema. GU: Extreme tenderness at base of penis, scrotum, and mostly on perineum.  Minimal swelling.  Exam limited by extreme pain Neurologic:  No motor abnormalities noted.    Most Recent Labs: Lab Results  Component Value Date   WBC 39.4 (H) 10/23/2018   HGB 14.8 10/23/2018   HCT 40.3 10/23/2018   PLT 370 10/23/2018    Lab Results  Component Value Date   NA 128 (L) 10/23/2018   K 3.2 (L) 10/23/2018   CL 90 (L) 10/23/2018   CO2 25 10/23/2018   BUN 19 10/23/2018   CREATININE 1.27 (H) 10/23/2018   CALCIUM 8.8 (L) 10/23/2018    Lab Results  Component Value Date  INR 1.0 10/23/2018     IMAGING: Ct Abdomen Pelvis W Contrast  Result Date: 10/24/2018 CLINICAL DATA:  38 year old male with abdominal pain and fever. Concern for abscess. EXAM: CT ABDOMEN AND PELVIS WITH CONTRAST TECHNIQUE: Multidetector CT imaging of the abdomen and pelvis was performed using the standard protocol following bolus administration of intravenous contrast. CONTRAST:  100mL OMNIPAQUE IOHEXOL 300 MG/ML  SOLN COMPARISON:  None. FINDINGS: Lower chest: The visualized lung bases are clear. No intra-abdominal free air or free fluid. Hepatobiliary: No focal liver abnormality is  seen. No gallstones, gallbladder wall thickening, or biliary dilatation. Pancreas: Unremarkable. No pancreatic ductal dilatation or surrounding inflammatory changes. Spleen: Normal in size without focal abnormality. Adrenals/Urinary Tract: Adrenal glands are unremarkable. Kidneys are normal, without renal calculi, focal lesion, or hydronephrosis. Bladder is unremarkable. Stomach/Bowel: There is no bowel obstruction. There is loose stool throughout the colon compatible with diarrheal state. Correlation with clinical exam and stool cultures recommended. The appendix is normal. Vascular/Lymphatic: No significant vascular findings are present. No enlarged abdominal or pelvic lymph nodes. Reproductive: The prostate and seminal vesicles are grossly unremarkable. No pelvic mass. There is inflammatory changes with diffuse thickening of the scrotal and penile skin. There is a 4 x 4 x 9 cm fluid collection along the inferior aspect of the proximal portion of the penis most consistent with an abscess. No soft tissue gas identified. Clinical correlation and urology consult is advised. Other: None Musculoskeletal: No acute or significant osseous findings. IMPRESSION: 1. Inflammatory changes of the skin of the penis and scrotum with a 4 x 4 x 9 cm abscess along the inferior aspect of the proximal penis. Clinical correlation and urology consult is advised. 2. Diarrheal state. Correlation with clinical exam and stool cultures recommended. No bowel obstruction. Normal appendix. Electronically Signed   By: Elgie CollardArash  Radparvar M.D.   On: 10/24/2018 03:46    ------  Assessment:  38 y.o. male with abscess coursing along the inferior portion of his urethra.  Starting in the penis and going down toward the perineum.  This is a somewhat unusual presentation for an otherwise healthy 38 year old male with no comorbidities.  We discussed that the abscess needs to be drained in the operating room.  Because it is so close to the urethra  we also talked about possible urethral damage and the need for urethral evaluation with cystourethroscopy.   Discussed risks of the procedure which include damage to the urethra, bladder, hematuria, need for second procedures, delayed wound healing, possible suprapubic tube in the event of urethral damage.    Plan: -OR for debridement of penile abscess, cystourethroscopy, possible suprapubic tube -Antibiotics are ready given -Consent order placed -Admit to urology

## 2018-10-24 NOTE — ED Provider Notes (Signed)
[facility]   CSN: [id number] Arrival date & time: 10/23/18  1808     History   Chief Complaint Chief Complaint  Patient presents with   Abscess   Fever    HPI Jason Oconnell is a 38 y.o. male. Patient seen immediately upon his arrival to the exam room by me at 0030.    Patient presents to the ED with a chief complaint of perineal pain.  He states that the symptoms have been gradually worsening for the past week.  He reports associated fevers and chills.  Reports severe pain with palpation. Reports associated urinary hesitancy, dysuria, hematuria and pain with BMs.  Denies penile discharge. [facility] for evaluation.  Denies cough, n/v/d.    The history is provided by the patient. No language interpreter was used.    History reviewed. No pertinent past medical history.  There are no active problems to display for this patient.   History reviewed. No pertinent surgical history.      Home Medications    Prior to Admission medications   Not on File    Family History No family history on file.  Social History Social History   Tobacco Use   Smoking status: Current Every Day Smoker    Packs/day: 1.00    Types: Cigarettes   Smokeless tobacco: Never Used  Substance Use Topics   Alcohol use: No    Frequency: Never   Drug use: Yes    Frequency: 5.0 times per week    Types: Marijuana     Allergies   Patient has no known allergies.   Review of Systems Review of Systems  All other systems reviewed and are negative.    Physical Exam Updated Vital Signs BP 126/85    Pulse (!) 102    Temp (!) 102.9 F (39.4 C) (Oral)    Resp 18    Ht 6' (1.829 m)    Wt 81.6 kg    SpO2 100%    BMI 24.41 kg/m   Physical Exam Vitals signs and nursing note reviewed.  Constitutional:      Appearance: He is well-developed.  HENT:     Head: Normocephalic and atraumatic.  Eyes:   Conjunctiva/sclera: Conjunctivae normal.  Neck:     Musculoskeletal: Neck supple.  Cardiovascular:     Rate and Rhythm: Regular rhythm. Tachycardia present.     Heart sounds: No murmur.  Pulmonary:     Effort: Pulmonary effort is normal. No respiratory distress.     Breath sounds: Normal breath sounds.  Abdominal:     Palpations: Abdomen is soft.     Tenderness: There is no abdominal tenderness.  Genitourinary:    Comments: RN and tech chaperones present Significant swelling, tenderness and pain of the perineum and scrotum, no tissue breakdown or visible erythema No perianal pain or tenderness Skin:    General: Skin is warm and dry.  Neurological:     Mental Status: He is alert.      ED Treatments / Results  Labs (all labs ordered are listed, but only abnormal results are displayed) Labs Reviewed  COMPREHENSIVE METABOLIC PANEL - Abnormal; Notable for the following components:      Result Value   Sodium 128 (*)    Potassium 3.2 (*)    Chloride 90 (*)    Glucose, Bld 117 (*)    Creatinine, Ser 1.27 (*)    Calcium 8.8 (*)  Total Protein 9.0 (*)    Albumin 3.2 (*)    Alkaline Phosphatase 211 (*)    Total Bilirubin 2.0 (*)    All other components within normal limits  CBC WITH DIFFERENTIAL/PLATELET - Abnormal; Notable for the following components:   WBC 39.4 (*)    MCHC 36.7 (*)    Neutro Abs 31.4 (*)    Monocytes Absolute 3.1 (*)    Abs Immature Granulocytes 1.55 (*)    All other components within normal limits  URINALYSIS, ROUTINE W REFLEX MICROSCOPIC - Abnormal; Notable for the following components:   Color, Urine AMBER (*)    APPearance HAZY (*)    Hgb urine dipstick SMALL (*)    Bilirubin Urine SMALL (*)    Protein, ur 100 (*)    Leukocytes,Ua MODERATE (*)    Bacteria, UA FEW (*)    All other components within normal limits  CULTURE, BLOOD (ROUTINE X 2)  CULTURE, BLOOD (ROUTINE X 2)  URINE CULTURE  LACTIC ACID, PLASMA  PROTIME-INR  LACTIC ACID, PLASMA      EKG None  Radiology Ct Abdomen Pelvis W Contrast  Result Date: 10/24/2018 CLINICAL DATA:  38 year old male with abdominal pain and fever. Concern for abscess. EXAM: CT ABDOMEN AND PELVIS WITH CONTRAST TECHNIQUE: Multidetector CT imaging of the abdomen and pelvis was performed using the standard protocol following bolus administration of intravenous contrast. CONTRAST:  100mL OMNIPAQUE IOHEXOL 300 MG/ML  SOLN COMPARISON:  None. FINDINGS: Lower chest: The visualized lung bases are clear. No intra-abdominal free air or free fluid. Hepatobiliary: No focal liver abnormality is seen. No gallstones, gallbladder wall thickening, or biliary dilatation. Pancreas: Unremarkable. No pancreatic ductal dilatation or surrounding inflammatory changes. Spleen: Normal in size without focal abnormality. Adrenals/Urinary Tract: Adrenal glands are unremarkable. Kidneys are normal, without renal calculi, focal lesion, or hydronephrosis. Bladder is unremarkable. Stomach/Bowel: There is no bowel obstruction. There is loose stool throughout the colon compatible with diarrheal state. Correlation with clinical exam and stool cultures recommended. The appendix is normal. Vascular/Lymphatic: No significant vascular findings are present. No enlarged abdominal or pelvic lymph nodes. Reproductive: The prostate and seminal vesicles are grossly unremarkable. No pelvic mass. There is inflammatory changes with diffuse thickening of the scrotal and penile skin. There is a 4 x 4 x 9 cm fluid collection along the inferior aspect of the proximal portion of the penis most consistent with an abscess. No soft tissue gas identified. Clinical correlation and urology consult is advised. Other: None Musculoskeletal: No acute or significant osseous findings. IMPRESSION: 1. Inflammatory changes of the skin of the penis and scrotum with a 4 x 4 x 9 cm abscess along the inferior aspect of the proximal penis. Clinical correlation and urology consult is  advised. 2. Diarrheal state. Correlation with clinical exam and stool cultures recommended. No bowel obstruction. Normal appendix. Electronically Signed   By: Elgie CollardArash  Radparvar M.D.   On: 10/24/2018 03:46    Procedures Procedures (including critical care time) CRITICAL CARE Performed by: Roxy Horsemanobert Canary Fister  Septic, multiple fluid boluses, multiple antibiotics, WBC 39, lactate 2.1 Total critical care time: 50 minutes  Critical care time was exclusive of separately billable procedures and treating other patients.  Critical care was necessary to treat or prevent imminent or life-threatening deterioration.  Critical care was time spent personally by me on the following activities: development of treatment plan with patient and/or surrogate as well as nursing, discussions with consultants, evaluation of patient's response to treatment, examination of patient, obtaining history from  patient or surrogate, ordering and performing treatments and interventions, ordering and review of laboratory studies, ordering and review of radiographic studies, pulse oximetry and re-evaluation of patient's condition.  Medications Ordered in ED Medications  oxyCODONE-acetaminophen (PERCOCET/ROXICET) 5-325 MG per tablet 1 tablet (1 tablet Oral Given 10/23/18 1853)  ceFEPIme (MAXIPIME) 2 g in sodium chloride 0.9 % 100 mL IVPB (has no administration in time range)  metroNIDAZOLE (FLAGYL) IVPB 500 mg (has no administration in time range)  lactated ringers bolus 1,000 mL (has no administration in time range)    And  lactated ringers bolus 1,000 mL (has no administration in time range)    And  lactated ringers bolus 500 mL (has no administration in time range)  vancomycin (VANCOCIN) 1,500 mg in sodium chloride 0.9 % 500 mL IVPB (has no administration in time range)  ceFEPIme (MAXIPIME) 2 g in sodium chloride 0.9 % 100 mL IVPB (has no administration in time range)  vancomycin (VANCOCIN) IVPB 750 mg/150 ml premix (has no  administration in time range)     Initial Impression / Assessment and Plan / ED Course  I have reviewed the triage vital signs and the nursing notes.  Pertinent labs & imaging results that were available during my care of the patient were reviewed by me and considered in my medical decision making (see chart for details).       0040 Called CT to get patient.  Code sepsis activated.  Concern for Fourneir's.  Acuity upgraded.  3:08 AM Delay in getting CT due to multiple leveled trauma patients requiring CT scanner.  Patient seen by and discussed with Dr. Roxanne Mins, who agrees with plan.  CT shows large abscess.  Case discussed with Dr. Anthony Sar, from urology.  Recommends transfer to [facility] and then to the OR.  Dr. Leonette Monarch accepting at [facility].  Call Dr. Anthony Sar [phone] upon arrival to [facility]  Final Clinical Impressions(s) / ED Diagnoses   Final diagnoses:  Perineal abscess    ED Discharge Orders    None       Montine Circle, PA-C 78/67/54 [id number]    Glick, David, MD 12/23/10 [phone]

## 2018-10-24 NOTE — ED Provider Notes (Signed)
Arrived from [facility] in stable condition. Urology contacted who will assess patient for the OR.    Fatima Blank, MD 10/24/18 [phone]

## 2018-10-24 NOTE — Progress Notes (Signed)
Pharmacy Antibiotic Note  Jason Oconnell is a 38 y.o. male admitted on 10/23/2018 with sepsis.  Pharmacy has been consulted for cefepime and vancomycin dosing. Cefepime 2gm ordered in ED  Plan: Vancomycin 1500 mg IV x 1 then 750 mg IV q8 hours Continue cefepime 2gm IV q8 hours F/u renal function, cultures and clinical course  Height: 6' (182.9 cm) Weight: 180 lb (81.6 kg) IBW/kg (Calculated) : 77.6  Temp (24hrs), Avg:100.1 F (37.8 C), Min:97.3 F (36.3 C), Max:102.9 F (39.4 C)  Recent Labs  Lab 10/23/18 1844 10/23/18 1920  WBC 39.4*  --   CREATININE 1.27*  --   LATICACIDVEN  --  1.4    Estimated Creatinine Clearance: 87.4 mL/min (A) (by C-G formula based on SCr of 1.27 mg/dL (H)).    No Known Allergies   Thank you for allowing pharmacy to be a part of this patient's care.  Excell Seltzer Poteet 10/24/2018 12:33 AM

## 2018-10-24 NOTE — ED Notes (Signed)
EMS dispatching a truck for transportation to [facility]

## 2018-10-25 ENCOUNTER — Encounter (HOSPITAL_COMMUNITY)
Admission: EM | Disposition: A | Discharge status: Home / Self Care | Payer: Self-pay | Source: Ambulatory Visit | Attending: Urology

## 2018-10-25 ENCOUNTER — Encounter (HOSPITAL_COMMUNITY): Payer: Self-pay | Admitting: Urology

## 2018-10-25 ENCOUNTER — Observation Stay (HOSPITAL_COMMUNITY): Payer: Self-pay | Admitting: Anesthesiology

## 2018-10-25 HISTORY — PX: ORCHIECTOMY: SHX2116

## 2018-10-25 LAB — CBC
HCT: 20.1 % — ABNORMAL LOW (ref 39.0–52.0)
Hemoglobin: 7 g/dL — ABNORMAL LOW (ref 13.0–17.0)
MCH: 31 pg (ref 26.0–34.0)
MCHC: 34.8 g/dL (ref 30.0–36.0)
MCV: 88.9 fL (ref 80.0–100.0)
Platelets: 380 10*3/uL (ref 150–400)
RBC: 2.26 MIL/uL — ABNORMAL LOW (ref 4.22–5.81)
RDW: 13.8 % (ref 11.5–15.5)
WBC: 30.9 10*3/uL — ABNORMAL HIGH (ref 4.0–10.5)
nRBC: 0 % (ref 0.0–0.2)

## 2018-10-25 LAB — HEMOGLOBIN AND HEMATOCRIT, BLOOD
HCT: 22.5 % — ABNORMAL LOW (ref 39.0–52.0)
Hemoglobin: 7.9 g/dL — ABNORMAL LOW (ref 13.0–17.0)

## 2018-10-25 LAB — ABO/RH: ABO/RH(D): O POS

## 2018-10-25 LAB — BASIC METABOLIC PANEL
Anion gap: 6 (ref 5–15)
BUN: 7 mg/dL (ref 6–20)
CO2: 27 mmol/L (ref 22–32)
Calcium: 7.7 mg/dL — ABNORMAL LOW (ref 8.9–10.3)
Chloride: 104 mmol/L (ref 98–111)
Creatinine, Ser: 0.68 mg/dL (ref 0.61–1.24)
GFR calc Af Amer: >60 mL/min (ref >60)
GFR calc non Af Amer: >60 mL/min (ref >60)
Glucose, Bld: 99 mg/dL (ref 70–99)
Potassium: 3.5 mmol/L (ref 3.5–5.1)
Sodium: 137 mmol/L (ref 135–145)

## 2018-10-25 LAB — URINE CULTURE: Culture: NO GROWTH

## 2018-10-25 LAB — HIV ANTIBODY (ROUTINE TESTING W REFLEX): HIV Screen 4th Generation wRfx: NONREACTIVE

## 2018-10-25 LAB — PREPARE RBC (CROSSMATCH)

## 2018-10-25 SURGERY — ORCHIECTOMY
Anesthesia: General | Site: Perineum

## 2018-10-25 MED ORDER — PHENYLEPHRINE 40 MCG/ML (10ML) SYRINGE FOR IV PUSH (FOR BLOOD PRESSURE SUPPORT)
PREFILLED_SYRINGE | INTRAVENOUS | Status: DC | PRN
  Administered 2018-10-25 (×5): 80 ug via INTRAVENOUS

## 2018-10-25 MED ORDER — LIDOCAINE 2% (20 MG/ML) 5 ML SYRINGE
INTRAMUSCULAR | Status: AC
  Filled 2018-10-25: qty 5

## 2018-10-25 MED ORDER — PROPOFOL 10 MG/ML IV BOLUS
INTRAVENOUS | Status: AC
  Filled 2018-10-25: qty 20

## 2018-10-25 MED ORDER — ONDANSETRON HCL 4 MG/2ML IJ SOLN
INTRAMUSCULAR | Status: AC
  Filled 2018-10-25: qty 2

## 2018-10-25 MED ORDER — DEXMEDETOMIDINE HCL 200 MCG/2ML IV SOLN
INTRAVENOUS | Status: DC | PRN
  Administered 2018-10-25 (×5): 8 ug via INTRAVENOUS

## 2018-10-25 MED ORDER — EPHEDRINE SULFATE-NACL 50-0.9 MG/10ML-% IV SOSY
PREFILLED_SYRINGE | INTRAVENOUS | Status: DC | PRN
  Administered 2018-10-25: 5 mg via INTRAVENOUS

## 2018-10-25 MED ORDER — LIDOCAINE 2% (20 MG/ML) 5 ML SYRINGE
INTRAMUSCULAR | Status: DC | PRN
  Administered 2018-10-25: 60 mg via INTRAVENOUS

## 2018-10-25 MED ORDER — DEXAMETHASONE SODIUM PHOSPHATE 10 MG/ML IJ SOLN
INTRAMUSCULAR | Status: AC
  Filled 2018-10-25: qty 1

## 2018-10-25 MED ORDER — PROPOFOL 10 MG/ML IV BOLUS
INTRAVENOUS | Status: DC | PRN
  Administered 2018-10-25: 200 mg via INTRAVENOUS

## 2018-10-25 MED ORDER — PHENYLEPHRINE 40 MCG/ML (10ML) SYRINGE FOR IV PUSH (FOR BLOOD PRESSURE SUPPORT)
PREFILLED_SYRINGE | INTRAVENOUS | Status: AC
  Filled 2018-10-25: qty 10

## 2018-10-25 MED ORDER — MIDAZOLAM HCL 2 MG/2ML IJ SOLN
INTRAMUSCULAR | Status: DC | PRN
  Administered 2018-10-25: 2 mg via INTRAVENOUS

## 2018-10-25 MED ORDER — MIDAZOLAM HCL 2 MG/2ML IJ SOLN
INTRAMUSCULAR | Status: AC
  Filled 2018-10-25: qty 2

## 2018-10-25 MED ORDER — DEXMEDETOMIDINE HCL IN NACL 200 MCG/50ML IV SOLN
INTRAVENOUS | Status: AC
  Filled 2018-10-25: qty 50

## 2018-10-25 MED ORDER — FENTANYL CITRATE (PF) 250 MCG/5ML IJ SOLN
INTRAMUSCULAR | Status: AC
  Filled 2018-10-25: qty 5

## 2018-10-25 MED ORDER — ROCURONIUM BROMIDE 10 MG/ML (PF) SYRINGE
PREFILLED_SYRINGE | INTRAVENOUS | Status: AC
  Filled 2018-10-25: qty 10

## 2018-10-25 MED ORDER — DEXAMETHASONE SODIUM PHOSPHATE 4 MG/ML IJ SOLN
INTRAMUSCULAR | Status: DC | PRN
  Administered 2018-10-25: 10 mg via INTRAVENOUS

## 2018-10-25 MED ORDER — SODIUM CHLORIDE 0.9% IV SOLUTION
Freq: Once | INTRAVENOUS | Status: AC
  Administered 2018-10-26: 14:00:00 via INTRAVENOUS

## 2018-10-25 MED ORDER — FENTANYL CITRATE (PF) 100 MCG/2ML IJ SOLN
INTRAMUSCULAR | Status: DC | PRN
  Administered 2018-10-25: 50 ug via INTRAVENOUS
  Administered 2018-10-25: 100 ug via INTRAVENOUS
  Administered 2018-10-25 (×5): 50 ug via INTRAVENOUS
  Administered 2018-10-25: 100 ug via INTRAVENOUS

## 2018-10-25 MED ORDER — SUCCINYLCHOLINE CHLORIDE 200 MG/10ML IV SOSY
PREFILLED_SYRINGE | INTRAVENOUS | Status: AC
  Filled 2018-10-25: qty 10

## 2018-10-25 MED ORDER — 0.9 % SODIUM CHLORIDE (POUR BTL) OPTIME
TOPICAL | Status: DC | PRN
  Administered 2018-10-25: 1000 mL

## 2018-10-25 SURGICAL SUPPLY — 31 items
BENZOIN TINCTURE PRP APPL 2/3 (GAUZE/BANDAGES/DRESSINGS) ×3 IMPLANT
BLADE HEX COATED 2.75 (ELECTRODE) ×3 IMPLANT
BNDG GAUZE ELAST 4 BULKY (GAUZE/BANDAGES/DRESSINGS) ×3 IMPLANT
COVER SURGICAL LIGHT HANDLE (MISCELLANEOUS) ×3 IMPLANT
COVER WAND RF STERILE (DRAPES) IMPLANT
DISSECTOR ROUND CHERRY 3/8 STR (MISCELLANEOUS) IMPLANT
DRAIN PENROSE 18X1/2 LTX STRL (DRAIN) IMPLANT
DRAPE LAPAROTOMY T 98X78 PEDS (DRAPES) IMPLANT
DRSG ADAPTIC 3X8 NADH LF (GAUZE/BANDAGES/DRESSINGS) ×3 IMPLANT
DRSG VAC ATS MED SENSATRAC (GAUZE/BANDAGES/DRESSINGS) ×3 IMPLANT
ELECT NEEDLE TIP 2.8 STRL (NEEDLE) IMPLANT
ELECT REM PT RETURN 15FT ADLT (MISCELLANEOUS) ×3 IMPLANT
GLOVE BIOGEL M STRL SZ7.5 (GLOVE) ×3 IMPLANT
GOWN STRL REUS W/TWL LRG LVL3 (GOWN DISPOSABLE) ×3 IMPLANT
KIT BASIN OR (CUSTOM PROCEDURE TRAY) ×3 IMPLANT
KIT TURNOVER KIT A (KITS) IMPLANT
NEEDLE HYPO 22GX1.5 SAFETY (NEEDLE) IMPLANT
NS IRRIG 1000ML POUR BTL (IV SOLUTION) ×3 IMPLANT
PACK GENERAL/GYN (CUSTOM PROCEDURE TRAY) ×3 IMPLANT
SHEET LAVH (DRAPES) ×3 IMPLANT
SUPPORT SCROTAL LG STRP (MISCELLANEOUS) IMPLANT
SUPPORTER ATHLETIC LG (MISCELLANEOUS)
SUT CHROMIC 2 0 SH (SUTURE) IMPLANT
SUT MNCRL AB 4-0 PS2 18 (SUTURE) ×3 IMPLANT
SUT SILK 0 SH 30 (SUTURE) IMPLANT
SUT VIC AB 2-0 SH 27 (SUTURE)
SUT VIC AB 2-0 SH 27X BRD (SUTURE) IMPLANT
SUT VICRYL 0 UR6 27IN ABS (SUTURE) IMPLANT
SYR CONTROL 10ML LL (SYRINGE) IMPLANT
TOWEL OR 17X26 10 PK STRL BLUE (TOWEL DISPOSABLE) ×6 IMPLANT
WATER STERILE IRR 1000ML POUR (IV SOLUTION) ×3 IMPLANT

## 2018-10-25 NOTE — Anesthesia Procedure Notes (Signed)
Procedure Name: LMA Insertion Date/Time: 10/25/2018 10:39 AM Performed by: Claudia Desanctis, CRNA Pre-anesthesia Checklist: Emergency Drugs available, Patient identified, Suction available and Patient being monitored Patient Re-evaluated:Patient Re-evaluated prior to induction Oxygen Delivery Method: Circle system utilized Preoxygenation: Pre-oxygenation with 100% oxygen Induction Type: IV induction LMA: LMA inserted LMA Size: 4.0 Number of attempts: 1 Placement Confirmation: positive ETCO2 and breath sounds checked- equal and bilateral Tube secured with: Tape Dental Injury: Teeth and Oropharynx as per pre-operative assessment

## 2018-10-25 NOTE — Op Note (Signed)
Operative Note  Preoperative diagnosis:  1.  Periurethral abscess 2.  Bulbar urethral injuries  Postoperative diagnosis: 1.  Periurethral abscess 2.  Bulbar urethral injuries  Procedure(s): 1.  Perineal exploration and wound debridement  2.  Interrupted closure of distal urethral injury 3.  Perineal wound VAC placement  Surgeon: Ellison Hughs, MD  Assistants: Tharon Aquas, MD PGY 4  Anesthesia:  General  Complications:  None  EBL: 10 mL  Specimens: 1.  None  Drains/Catheters: 1.  Wound VAC  Intraoperative findings:   1. Granulation tissue forming on the corpus spongiosis 2.   No residual bleeding was seen from the region of the left bulbar artery 3.   There were 2 areas of urethral disruption, both involving the bulbar urethra.  The more distal disruption was on the lateral aspect of the urethra and measured approximately 2 cm in length.  The more proximal disruption measures approximately 1 cm in length and is also on the lateral aspect of the bulbar urethra.  The more distal disruption was reapproximated using interrupted 4-0 Monocryl.  The more proximal defect was not repaired due to the fact that the urethra would need to be mobilized and the tissue edges still appeared somewhat necrotic.  Indication:  Jason Oconnell is a 38 y.o. male with a periurethral abscess that was drained on 10/24/18.  Surgery yesterday was complicated by considerable bleeding from the left bulbar artery.  He is here today for reinspection of his wound and dressing change.    Description of procedure:  After informed consent was obtained, the patient was brought to the operating room and general anesthesia was administered.  The patient was placed in the dorsolithotomy position and prepped and draped in usual sterile fashion.  A timeout was then performed.  The patient's perineal wound was opened and all packing was removed.  There appeared to be excellent granulation tissue forming over the  anterior aspects of the corpus spongiosum as well as along the previous abscess cavity, which measured approximately 10 x 4 cm and extended from the bulbar urethra and more cephalad into the left hemiscrotal region.  The area in the left lower quadrant of the perineal wound appeared hemostatic.  We then sharply debrided fibrinous tissue overlying the corpora spongiosum as well as along the lateral aspects of the bulbar urethra.  The urethral defects were identified as outlined above.  The more distal urethral defect was reapproximated using interrupted 4-0 Monocryl.  The more proximal defect was more difficult to access and the tissue integrity surrounding that area of urethra was less than ideal for closure.  We then extensively irrigated the wound and then placed a wound VAC.  We first started by placing Adaptic over the left lower quadrant of the wound as well as over the corpus spongiosum.  A piece of white gauze was then laid down in the left lower quadrant of the wound and then a piece of black gauze was placed within the abscess cavity itself.  Another piece of black gauze was then placed over the superficial aspects of the perineal wound and placed to suction.  Patient tolerated the procedure well.  He was awoken from anesthesia and transferred to the postanesthesia unit in stable condition.  Plan: The patient was transfused 1 unit of packed red blood cells intraoperatively.  We will plan to recheck a H/H this afternoon.  Tentative plan is to keep the wound VAC in place for 2 to 3 days and then bring the patient  back to the operating room for primary closure of his perineal wound.

## 2018-10-25 NOTE — Anesthesia Postprocedure Evaluation (Signed)
Anesthesia Post Note  Patient: Jason Oconnell  Procedure(s) Performed: perineal wound exploration and debridement (N/A Perineum)     Patient location during evaluation: PACU Anesthesia Type: General Level of consciousness: awake and alert Pain management: pain level controlled Vital Signs Assessment: post-procedure vital signs reviewed and stable Respiratory status: spontaneous breathing, nonlabored ventilation, respiratory function stable and patient connected to nasal cannula oxygen Cardiovascular status: blood pressure returned to baseline and stable Postop Assessment: no apparent nausea or vomiting Anesthetic complications: no    Last Vitals:  Vitals:   10/25/18 0922 10/25/18 1214  BP: 127/78 132/74  Pulse: 91 82  Resp: 16 (!) 21  Temp: 37.3 C 36.8 C  SpO2: 100% 100%    Last Pain:  Vitals:   10/25/18 1215  TempSrc:   PainSc: [place] A Azlin Zilberman

## 2018-10-25 NOTE — Transfer of Care (Signed)
Immediate Anesthesia Transfer of Care Note  Patient: Jason Oconnell  Procedure(s) Performed: perineal wound exploration and debridement (N/A Perineum)  Patient Location: PACU  Anesthesia Type:General  Level of Consciousness: awake, alert , oriented and patient cooperative  Airway & Oxygen Therapy: Patient Spontanous Breathing and Patient connected to face mask  Post-op Assessment: Report given to RN and Post -op Vital signs reviewed and stable  Post vital signs: Reviewed and stable  Last Vitals:  Vitals Value Taken Time  BP    Temp    Pulse    Resp    SpO2      Last Pain:  Vitals:   10/25/18 0922  TempSrc: Oral  PainSc:       Patients Stated Pain Goal: 4 (74/12/87 8676)  Complications: No apparent anesthesia complications

## 2018-10-25 NOTE — Progress Notes (Addendum)
Urology Progress Note   1 Day Post-Op s/p perineal abscess debridement complicated by bulbar artery bleeding and necrotic urethra.  Subjective: Did well overnight, pain controlled. Hgb 7.0 this am but hemodynamically stable   Objective: Vital signs in last 24 hours: Temp:  [97.5 F (36.4 C)-99.5 F (37.5 C)] 99.5 F (37.5 C) (08/09 0640) Pulse Rate:  [91-109] 91 (08/09 0922) Resp:  [15-23] 16 (08/09 0922) BP: (109-157)/(60-99) 127/78 (08/09 0922) SpO2:  [94 %-100 %] 100 % (08/09 0922) Weight:  [97.3 kg] 97.3 kg (08/08 1500)  Intake/Output from previous day: 08/08 0701 - 08/09 0700 In: 5262.8 [P.O.:358; I.V.:4154.8; IV Piggyback:750] Out: 1610 [Urine:2755; Blood:1075] Intake/Output this shift: No intake/output data recorded.  Physical Exam:  General Appearance:  No acute distress. Alert and oriented x 3. Pulmonary: Normal respiratory effort on room air Cardiovascular: Regular rate Abdomen: Soft, non-tender, without masses. Musculoskeletal:Extremities without edema. GU: SP with clear yellow urine. Urethral foley capped. Incision c/d/i with minimal leaking or swelling. NO evidence of hematoma in perineum  Neurologic:  No motor abnormalities noted.    Lab Results: Recent Labs    10/23/18 1844 10/24/18 1110 10/25/18 0457  HGB 14.8 10.2* 7.0*  HCT 40.3 30.0* 20.1*   BMET Recent Labs    10/23/18 1844 10/24/18 1110 10/25/18 0457  NA 128* 135 137  K 3.2* 4.5 3.5  CL 90*  --  104  CO2 25  --  27  GLUCOSE 117* 108* 99  BUN 19  --  7  CREATININE 1.27*  --  0.68  CALCIUM 8.8*  --  7.7*     Studies/Results: Ct Abdomen Pelvis W Contrast  Result Date: 10/24/2018 CLINICAL DATA:  38 year old male with abdominal pain and fever. Concern for abscess. EXAM: CT ABDOMEN AND PELVIS WITH CONTRAST TECHNIQUE: Multidetector CT imaging of the abdomen and pelvis was performed using the standard protocol following bolus administration of intravenous contrast. CONTRAST:  152mL  OMNIPAQUE IOHEXOL 300 MG/ML  SOLN COMPARISON:  None. FINDINGS: Lower chest: The visualized lung bases are clear. No intra-abdominal free air or free fluid. Hepatobiliary: No focal liver abnormality is seen. No gallstones, gallbladder wall thickening, or biliary dilatation. Pancreas: Unremarkable. No pancreatic ductal dilatation or surrounding inflammatory changes. Spleen: Normal in size without focal abnormality. Adrenals/Urinary Tract: Adrenal glands are unremarkable. Kidneys are normal, without renal calculi, focal lesion, or hydronephrosis. Bladder is unremarkable. Stomach/Bowel: There is no bowel obstruction. There is loose stool throughout the colon compatible with diarrheal state. Correlation with clinical exam and stool cultures recommended. The appendix is normal. Vascular/Lymphatic: No significant vascular findings are present. No enlarged abdominal or pelvic lymph nodes. Reproductive: The prostate and seminal vesicles are grossly unremarkable. No pelvic mass. There is inflammatory changes with diffuse thickening of the scrotal and penile skin. There is a 4 x 4 x 9 cm fluid collection along the inferior aspect of the proximal portion of the penis most consistent with an abscess. No soft tissue gas identified. Clinical correlation and urology consult is advised. Other: None Musculoskeletal: No acute or significant osseous findings. IMPRESSION: 1. Inflammatory changes of the skin of the penis and scrotum with a 4 x 4 x 9 cm abscess along the inferior aspect of the proximal penis. Clinical correlation and urology consult is advised. 2. Diarrheal state. Correlation with clinical exam and stool cultures recommended. No bowel obstruction. Normal appendix. Electronically Signed   By: Anner Crete M.D.   On: 10/24/2018 03:46    Assessment/Plan:  38 y.o. male s/p abscess debridement  -  GNR and GPC on gram stain. Continue broad abx - OR today for dressing change, washout, hemostasis.   Dispo: Floor    LOS: 0 days   Federico FlakeSolomon Kaleia Longhi 10/25/2018, 9:30 AM

## 2018-10-26 ENCOUNTER — Encounter (HOSPITAL_COMMUNITY): Payer: Self-pay | Admitting: Urology

## 2018-10-26 ENCOUNTER — Inpatient Hospital Stay (HOSPITAL_COMMUNITY): Payer: Self-pay

## 2018-10-26 LAB — SURGICAL PCR SCREEN
MRSA, PCR: NEGATIVE
Staphylococcus aureus: NEGATIVE

## 2018-10-26 LAB — HEMOGLOBIN AND HEMATOCRIT, BLOOD
HCT: 21 % — ABNORMAL LOW (ref 39.0–52.0)
Hemoglobin: 7 g/dL — ABNORMAL LOW (ref 13.0–17.0)

## 2018-10-26 MED ORDER — AMOXICILLIN-POT CLAVULANATE 875-125 MG PO TABS
1.0000 | ORAL_TABLET | Freq: Two times a day (BID) | ORAL | Status: DC
  Administered 2018-10-26 – 2018-10-28 (×5): 1 via ORAL
  Filled 2018-10-26 (×5): qty 1

## 2018-10-26 MED ORDER — SODIUM CHLORIDE (PF) 0.9 % IJ SOLN
INTRAMUSCULAR | Status: AC
  Filled 2018-10-26: qty 50

## 2018-10-26 MED ORDER — IOHEXOL 300 MG/ML  SOLN
100.0000 mL | Freq: Once | INTRAMUSCULAR | Status: AC | PRN
  Administered 2018-10-26: 100 mL via INTRAVENOUS

## 2018-10-26 NOTE — Progress Notes (Signed)
Urology Progress Note   38 yo  s/p perineal abscess debridement complicated by bulbar artery bleeding and necrotic urethra.  Washout and wound VAC placement on 8/9  Subjective: Did well overnight, pain controlled and vacuum holding well.  However, hemoglobin has drifted back down to 7.0 from 7.9.  Concern for bulbar artery bleeding deep within the pelvis  Objective: Vital signs in last 24 hours: Temp:  [98.3 F (36.8 C)-99.8 F (37.7 C)] 98.7 F (37.1 C) (08/10 0559) Pulse Rate:  [80-91] 80 (08/10 0559) Resp:  [16-21] 16 (08/10 0559) BP: (127-132)/(70-78) 131/76 (08/10 0559) SpO2:  [96 %-100 %] 98 % (08/10 0559)  Intake/Output from previous day: 08/09 0701 - 08/10 0700 In: 3417.5 [P.O.:840; I.V.:1512.5; Blood:315; IV Piggyback:750] Out: 1505 [Urine:3710; Blood:100] Intake/Output this shift: No intake/output data recorded.  Physical Exam:  General Appearance:  No acute distress. Alert and oriented x 3. Pulmonary: Normal respiratory effort on room air Cardiovascular: Regular rate Abdomen: Soft, non-tender, without masses. Musculoskeletal:Extremities without edema. GU: SP with clear yellow urine. Urethral foley capped. Incision c/d/i with minimal leaking or swelling. NO evidence of hematoma in perineum  Neurologic:  No motor abnormalities noted.    Lab Results: Recent Labs    10/25/18 0457 10/25/18 1656 10/26/18 0540  HGB 7.0* 7.9* 7.0*  HCT 20.1* 22.5* 21.0*   BMET Recent Labs    10/23/18 1844 10/24/18 1110 10/25/18 0457  NA 128* 135 137  K 3.2* 4.5 3.5  CL 90*  --  104  CO2 25  --  27  GLUCOSE 117* 108* 99  BUN 19  --  7  CREATININE 1.27*  --  0.68  CALCIUM 8.8*  --  7.7*     Studies/Results: No results found.  Assessment/Plan:  38 y.o. male s/p abscess debridement  -Urine cultures no growth but were obtained after starting empiric antibiotics.  Transition to oral Augmentin and discontinue cefepime and vancomycin  - Stat CT pelvis with and  without contrast to evaluate for pelvic bleed and hematoma given continued drop in hemoglobin despite transfusion.  May need IR embolization.  -N.p.o. with IV fluids  -P.m. hemoglobin check, 2 PM  -Continue wound VAC.  Tentative plan for change in the operating room with partial closure on Tuesday or Wednesday  Dispo: Floor   LOS: 1 day   Tharon Aquas 10/26/2018, 7:33 AM

## 2018-10-27 ENCOUNTER — Encounter (HOSPITAL_COMMUNITY): Payer: Self-pay | Admitting: General Practice

## 2018-10-27 ENCOUNTER — Inpatient Hospital Stay (HOSPITAL_COMMUNITY): Payer: Self-pay | Admitting: Anesthesiology

## 2018-10-27 ENCOUNTER — Encounter (HOSPITAL_COMMUNITY)
Admission: EM | Disposition: A | Discharge status: Home / Self Care | Payer: Self-pay | Source: Ambulatory Visit | Attending: Urology

## 2018-10-27 HISTORY — PX: IRRIGATION AND DEBRIDEMENT ABSCESS: SHX5252

## 2018-10-27 LAB — TYPE AND SCREEN
ABO/RH(D): O POS
Antibody Screen: NEGATIVE
Unit division: 0
Unit division: 0

## 2018-10-27 LAB — BPAM RBC
Blood Product Expiration Date: 202009022359
Blood Product Expiration Date: 202009032359
ISSUE DATE / TIME: 202008091113
ISSUE DATE / TIME: 202008101349
Unit Type and Rh: 5100
Unit Type and Rh: 5100

## 2018-10-27 LAB — HEMOGLOBIN AND HEMATOCRIT, BLOOD
HCT: 25.3 % — ABNORMAL LOW (ref 39.0–52.0)
Hemoglobin: 8.6 g/dL — ABNORMAL LOW (ref 13.0–17.0)

## 2018-10-27 SURGERY — IRRIGATION AND DEBRIDEMENT ABSCESS
Anesthesia: General

## 2018-10-27 MED ORDER — PROPOFOL 10 MG/ML IV BOLUS
INTRAVENOUS | Status: AC
  Filled 2018-10-27: qty 20

## 2018-10-27 MED ORDER — BUPIVACAINE-EPINEPHRINE 0.5% -1:200000 IJ SOLN
INTRAMUSCULAR | Status: AC
  Filled 2018-10-27: qty 1

## 2018-10-27 MED ORDER — FENTANYL CITRATE (PF) 100 MCG/2ML IJ SOLN
INTRAMUSCULAR | Status: AC
  Filled 2018-10-27: qty 2

## 2018-10-27 MED ORDER — DEXMEDETOMIDINE HCL 200 MCG/2ML IV SOLN
INTRAVENOUS | Status: DC | PRN
  Administered 2018-10-27 (×4): 8 ug via INTRAVENOUS

## 2018-10-27 MED ORDER — LIDOCAINE 2% (20 MG/ML) 5 ML SYRINGE
INTRAMUSCULAR | Status: DC | PRN
  Administered 2018-10-27: 100 mg via INTRAVENOUS

## 2018-10-27 MED ORDER — ONDANSETRON HCL 4 MG/2ML IJ SOLN
INTRAMUSCULAR | Status: DC | PRN
  Administered 2018-10-27: 4 mg via INTRAVENOUS

## 2018-10-27 MED ORDER — PROPOFOL 10 MG/ML IV BOLUS
INTRAVENOUS | Status: DC | PRN
  Administered 2018-10-27: 200 mg via INTRAVENOUS

## 2018-10-27 MED ORDER — DEXMEDETOMIDINE HCL IN NACL 200 MCG/50ML IV SOLN
INTRAVENOUS | Status: AC
  Filled 2018-10-27: qty 50

## 2018-10-27 MED ORDER — ONDANSETRON HCL 4 MG/2ML IJ SOLN
INTRAMUSCULAR | Status: AC
  Filled 2018-10-27: qty 2

## 2018-10-27 MED ORDER — HYDROMORPHONE HCL 2 MG/ML IJ SOLN
INTRAMUSCULAR | Status: AC
  Filled 2018-10-27: qty 1

## 2018-10-27 MED ORDER — BUPIVACAINE-EPINEPHRINE 0.5% -1:200000 IJ SOLN
INTRAMUSCULAR | Status: DC | PRN
  Administered 2018-10-27: 20 mL

## 2018-10-27 MED ORDER — FENTANYL CITRATE (PF) 250 MCG/5ML IJ SOLN
INTRAMUSCULAR | Status: AC
  Filled 2018-10-27: qty 5

## 2018-10-27 MED ORDER — 0.9 % SODIUM CHLORIDE (POUR BTL) OPTIME
TOPICAL | Status: DC | PRN
  Administered 2018-10-27: 1000 mL

## 2018-10-27 MED ORDER — FENTANYL CITRATE (PF) 100 MCG/2ML IJ SOLN
INTRAMUSCULAR | Status: DC | PRN
  Administered 2018-10-27 (×3): 50 ug via INTRAVENOUS
  Administered 2018-10-27 (×2): 100 ug via INTRAVENOUS

## 2018-10-27 MED ORDER — LIDOCAINE 2% (20 MG/ML) 5 ML SYRINGE
INTRAMUSCULAR | Status: AC
  Filled 2018-10-27: qty 10

## 2018-10-27 MED ORDER — MIDAZOLAM HCL 5 MG/5ML IJ SOLN
INTRAMUSCULAR | Status: DC | PRN
  Administered 2018-10-27: 2 mg via INTRAVENOUS

## 2018-10-27 MED ORDER — LACTATED RINGERS IV SOLN
INTRAVENOUS | Status: DC
  Administered 2018-10-27: 14:00:00 via INTRAVENOUS

## 2018-10-27 MED ORDER — HYDROMORPHONE HCL 1 MG/ML IJ SOLN
INTRAMUSCULAR | Status: DC | PRN
  Administered 2018-10-27 (×2): 1 mg via INTRAVENOUS

## 2018-10-27 MED ORDER — MIDAZOLAM HCL 2 MG/2ML IJ SOLN
INTRAMUSCULAR | Status: AC
  Filled 2018-10-27: qty 2

## 2018-10-27 SURGICAL SUPPLY — 43 items
BAG URINE DRAINAGE (UROLOGICAL SUPPLIES) ×3 IMPLANT
BENZOIN TINCTURE PRP APPL 2/3 (GAUZE/BANDAGES/DRESSINGS) IMPLANT
BLADE SURG SZ10 CARB STEEL (BLADE) ×3 IMPLANT
BNDG GAUZE ELAST 4 BULKY (GAUZE/BANDAGES/DRESSINGS) ×3 IMPLANT
CLOSURE WOUND 1/2 X4 (GAUZE/BANDAGES/DRESSINGS)
COVER SURGICAL LIGHT HANDLE (MISCELLANEOUS) ×3 IMPLANT
COVER WAND RF STERILE (DRAPES) IMPLANT
DECANTER SPIKE VIAL GLASS SM (MISCELLANEOUS) IMPLANT
DERMABOND ADVANCED (GAUZE/BANDAGES/DRESSINGS) ×2
DERMABOND ADVANCED .7 DNX12 (GAUZE/BANDAGES/DRESSINGS) ×1 IMPLANT
DRAIN PENROSE 18X1/2 LTX STRL (DRAIN) ×3 IMPLANT
DRAPE LAPAROTOMY TRNSV 102X78 (DRAPES) IMPLANT
DRAPE POUCH INSTRU U-SHP 10X18 (DRAPES) ×3 IMPLANT
DRAPE SHEET LG 3/4 BI-LAMINATE (DRAPES) IMPLANT
ELECT PENCIL ROCKER SW 15FT (MISCELLANEOUS) ×3 IMPLANT
ELECT REM PT RETURN 15FT ADLT (MISCELLANEOUS) ×3 IMPLANT
EVACUATOR SILICONE 100CC (DRAIN) IMPLANT
GAUZE SPONGE 4X4 12PLY STRL (GAUZE/BANDAGES/DRESSINGS) IMPLANT
GLOVE BIOGEL PI IND STRL 7.0 (GLOVE) ×1 IMPLANT
GLOVE BIOGEL PI INDICATOR 7.0 (GLOVE) ×2
GOWN STRL REUS W/TWL LRG LVL3 (GOWN DISPOSABLE) ×3 IMPLANT
GOWN STRL REUS W/TWL XL LVL3 (GOWN DISPOSABLE) ×6 IMPLANT
KIT BASIN OR (CUSTOM PROCEDURE TRAY) ×3 IMPLANT
KIT TURNOVER KIT A (KITS) IMPLANT
NEEDLE HYPO 25X1 1.5 SAFETY (NEEDLE) ×3 IMPLANT
NS IRRIG 1000ML POUR BTL (IV SOLUTION) ×3 IMPLANT
PACK GENERAL/GYN (CUSTOM PROCEDURE TRAY) ×3 IMPLANT
SOL PREP POV-IOD 4OZ 10% (MISCELLANEOUS) ×6 IMPLANT
SPONGE LAP 18X18 RF (DISPOSABLE) ×3 IMPLANT
SPONGE LAP 4X18 RFD (DISPOSABLE) IMPLANT
STAPLER VISISTAT 35W (STAPLE) IMPLANT
STRIP CLOSURE SKIN 1/2X4 (GAUZE/BANDAGES/DRESSINGS) IMPLANT
SUPPORT SCROTAL LG STRP (MISCELLANEOUS) ×2 IMPLANT
SUPPORTER ATHLETIC LG (MISCELLANEOUS) ×1
SUT ETHILON 2 0 PS N (SUTURE) ×3 IMPLANT
SUT MNCRL AB 4-0 PS2 18 (SUTURE) ×6 IMPLANT
SUT VIC AB 3-0 SH 18 (SUTURE) IMPLANT
SUT VIC AB 3-0 SH 27 (SUTURE) ×4
SUT VIC AB 3-0 SH 27XBRD (SUTURE) ×2 IMPLANT
SYR CONTROL 10ML LL (SYRINGE) ×3 IMPLANT
TOWEL OR 17X26 10 PK STRL BLUE (TOWEL DISPOSABLE) ×3 IMPLANT
WATER STERILE IRR 1000ML POUR (IV SOLUTION) ×3 IMPLANT
YANKAUER SUCT BULB TIP 10FT TU (MISCELLANEOUS) IMPLANT

## 2018-10-27 NOTE — Op Note (Signed)
Operative Note  Preoperative diagnosis:  1.  Periurethral abscess 2.  Bulbar urethral injuries  Postoperative diagnosis: 1.  Periurethral abscess 2.  Bulbar urethral injuries  Procedure(s): 1.  Perineal exploration and wound debridement  2.  Multiple layer closure of perineal wound  Surgeon: Ellison Hughs, MD  Assistants: Tharon Aquas, MD PGY 4  Anesthesia:  General  Complications:  None  EBL: 10 mL  Specimens: 1.  None  Drains/Catheters: 1.  1/2 inch penrose from upper left groin exiting to inferior left groin  Intraoperative findings:   1. Granulation tissue forming on the corpus spongiosis as well as in the surrounding prior abscess/resection bed 2. No residual abscess or pocket of infected material 3. Small debridement of left lateral bulbospongiosus muscle performed.  Other tissue healthy and bleeding with irritation using lap sponge 4.   Previous repair of left lateral urethral tear fell apart due to necrotic tissues, attempted to repair again but unable due to poor tissue integrity  5. As previously dictated there were still  2 areas of urethral disruption, both involving the bulbar urethra.  The more distal disruption was on the lateral aspect of the urethra and measured approximately 2 cm in length.  The more proximal disruption measures approximately 1 cm in length and is also on the lateral aspect of the bulbar urethra.    Indication:  Jason Oconnell is a 38 y.o. male with a periurethral abscess that was drained on 10/24/18.  Surgery was complicated by considerable bleeding from the left bulbar artery.  Underwent repeat debridement/washout on 8/9 with wound vac placement. Has been doing well with no further infectious symptoms. He is here today for reinspection of his wound, washout, and possible closure.  Description of procedure:  After informed consent was obtained, the patient was brought to the operating room and general anesthesia was administered. The  patient was placed in the dorsolithotomy position and previous wound vac was taken down.  2 pieces of black sponge, 1 piece of white foam, 2 pieces of Adaptic were removed.  The perineal wound was then prepped and draped in usual sterile fashion.  A timeout was then performed.  The wound bed was irrigated and examined in detail.  There was fibrinous debris on the corpora spongiosis as well other aspects of the resection bed.  This was easily taken off with lap sponge and the underlying tissue was healthy with small amount of oozing after tissue irritation.  We examined the site of previous urethra repair which came apart due to poor tissue integrity.  Catheter was visible through the previously noted areas of urethral disruption.  We attempted repair again but stitches pulled through the tissue and decision was made to leave this area open.  A small amount of debridement was performed on the left lateral bulbospongiosus muscle.  Thorough inspection of the rest of the wound revealed no other area that needed debridement.  The wound was copiously irrigated once again.  1/2 inch Penrose drain was advanced into the left upper groin portion of the wound and carried down to the inferior portion of the wound.  Using Bovie electrocautery the Penrose drain was brought through the skin at the left lateral inferior portion of the wound.  Affixed to the skin using 2-0 nylon.  Decision was then made to close the wound in layers as there was no further necrotic tissue.  Our hope was that by closing the wound and layers future reconstruction would be easier and anatomical landmarks would  be at least somewhat preserved.  There was minimal healthy bulbospongiosus left to close and so this layer was left open.  The first layer above the bulbospongiosus was closed using interrupted 3-0 Vicryl.  A second dermal layer was closed using 3-0 running Vicryl.  Final skin layer was closed using horizontal mattress running 4-0  Monocryl.  Wound was cleaned and dried and Dermabond was placed on top of the skin edge.  The patient was awoken and taken to the recovery area.  Stable condition.   Plan:  -Return to floor -Continue Augmentin -Continue Penrose drain for likely 2 weeks postoperatively - If patient is doing well and labs are stable tomorrow morning can likely discharge -We will keep urethral catheter capped and in place for at least 2 weeks, final timing TBD -Suprapubic tube to drainage

## 2018-10-27 NOTE — Transfer of Care (Signed)
Immediate Anesthesia Transfer of Care Note  Patient: Jason Oconnell  Procedure(s) Performed: Perineal wound closure (N/A )  Patient Location: PACU  Anesthesia Type:General  Level of Consciousness: drowsy and patient cooperative  Airway & Oxygen Therapy: Patient Spontanous Breathing and Patient connected to face mask oxygen  Post-op Assessment: Report given to RN and Post -op Vital signs reviewed and stable  Post vital signs: Reviewed and stable  Last Vitals:  Vitals Value Taken Time  BP 157/84 10/27/18 1522  Temp    Pulse 57 10/27/18 1524  Resp 23 10/27/18 1524  SpO2 100 % 10/27/18 1524  Vitals shown include unvalidated device data.  Last Pain:  Vitals:   10/27/18 1324  TempSrc: Oral  PainSc:       Patients Stated Pain Goal: 3 (71/24/58 0998)  Complications: No apparent anesthesia complications

## 2018-10-27 NOTE — Anesthesia Postprocedure Evaluation (Signed)
Anesthesia Post Note  Patient: Jason Oconnell  Procedure(s) Performed: Perineal wound closure (N/A )     Patient location during evaluation: PACU Anesthesia Type: General Level of consciousness: awake Pain management: pain level controlled Vital Signs Assessment: post-procedure vital signs reviewed and stable Cardiovascular status: stable Postop Assessment: no apparent nausea or vomiting Anesthetic complications: no    Last Vitals:  Vitals:   10/27/18 1600 10/27/18 1616  BP: (!) 145/82 (!) 143/86  Pulse: 74 62  Resp: 17   Temp: 36.7 C 37.1 C  SpO2: 94% 96%    Last Pain:  Vitals:   10/27/18 2046  TempSrc:   PainSc: 8                  Elmo Rio

## 2018-10-27 NOTE — Anesthesia Procedure Notes (Signed)
Procedure Name: LMA Insertion Date/Time: 10/27/2018 2:02 PM Performed by: Gwyndolyn Saxon, CRNA Pre-anesthesia Checklist: Patient identified, Emergency Drugs available, Suction available and Patient being monitored Patient Re-evaluated:Patient Re-evaluated prior to induction Oxygen Delivery Method: Circle system utilized Preoxygenation: Pre-oxygenation with 100% oxygen Induction Type: IV induction Ventilation: Mask ventilation without difficulty LMA: LMA inserted LMA Size: 4.0 Number of attempts: 1 Placement Confirmation: positive ETCO2 and breath sounds checked- equal and bilateral Tube secured with: Tape Dental Injury: Teeth and Oropharynx as per pre-operative assessment

## 2018-10-27 NOTE — Anesthesia Preprocedure Evaluation (Signed)
Anesthesia Evaluation  Patient identified by MRN, date of birth, ID band Patient awake    Reviewed: Allergy & Precautions, NPO status , Patient's Chart, lab work & pertinent test results  Airway Mallampati: II  TM Distance: >3 FB     Dental   Pulmonary Current Smoker,    breath sounds clear to auscultation       Cardiovascular negative cardio ROS   Rhythm:Regular Rate:Normal     Neuro/Psych    GI/Hepatic negative GI ROS, Neg liver ROS,   Endo/Other  negative endocrine ROS  Renal/GU negative Renal ROS     Musculoskeletal   Abdominal   Peds  Hematology   Anesthesia Other Findings   Reproductive/Obstetrics                             Anesthesia Physical Anesthesia Plan  ASA: II  Anesthesia Plan: General   Post-op Pain Management:    Induction: Intravenous  PONV Risk Score and Plan: 1 and Midazolam  Airway Management Planned: Oral ETT  Additional Equipment:   Intra-op Plan:   Post-operative Plan: Extubation in OR  Informed Consent: I have reviewed the patients History and Physical, chart, labs and discussed the procedure including the risks, benefits and alternatives for the proposed anesthesia with the patient or authorized representative who has indicated his/her understanding and acceptance.     Dental advisory given  Plan Discussed with: CRNA and Anesthesiologist  Anesthesia Plan Comments:         Anesthesia Quick Evaluation

## 2018-10-27 NOTE — Progress Notes (Signed)
Urology Progress Note   38 yo  s/p perineal abscess debridement complicated by bulbar artery bleeding and necrotic urethra.  Washout and wound VAC placement on 8/9  Subjective: No issues overnight Vac holding. CT without bleed. Hgb stable. Pain controlled using IV and oral pain meds.   Explained procedure plan for today in detail. Washout, possible closure. Also discussed wet to dry or replacing vac.  Objective: Vital signs in last 24 hours: Temp:  [98.6 F (37 C)-100.1 F (37.8 C)] 99.2 F (37.3 C) (08/11 0507) Pulse Rate:  [68-88] 68 (08/11 0507) Resp:  [16-18] 16 (08/11 0507) BP: (119-142)/(66-78) 141/75 (08/11 0507) SpO2:  [96 %-99 %] 99 % (08/11 0507)  Intake/Output from previous day: 08/10 0701 - 08/11 0700 In: 4095.8 [P.O.:480; I.V.:3263.3; Blood:352.5] Out: 2650 [Urine:2450; Drains:200] Intake/Output this shift: No intake/output data recorded.  Physical Exam:  General Appearance:  No acute distress. Alert and oriented x 3. Pulmonary: Normal respiratory effort on room air Cardiovascular: Regular rate Abdomen: Soft, non-tender, without masses. Musculoskeletal:Extremities without edema. GU: SP with clear yellow urine. Urethral foley capped. Vac in place, holding well. NO evidence of hematoma in perineum  Neurologic:  No motor abnormalities noted.    Lab Results: Recent Labs    10/25/18 1656 10/26/18 0540 10/27/18 0405  HGB 7.9* 7.0* 8.6*  HCT 22.5* 21.0* 25.3*   BMET Recent Labs    10/24/18 1110 10/25/18 0457  NA 135 137  K 4.5 3.5  CL  --  104  CO2  --  27  GLUCOSE 108* 99  BUN  --  7  CREATININE  --  0.68  CALCIUM  --  7.7*     Studies/Results: Ct Pelvis W Contrast  Result Date: 10/26/2018 CLINICAL DATA:  Penal abscess debridement.  Persistent pain. EXAM: CT PELVIS WITH CONTRAST TECHNIQUE: Multidetector CT imaging of the pelvis was performed using the standard protocol following the bolus administration of intravenous contrast. CONTRAST:  152mL  OMNIPAQUE IOHEXOL 300 MG/ML  SOLN COMPARISON:  CT 10/24/2018 FINDINGS: Urinary Tract: There is a Foley catheter and suprapubic catheter positioned within the urinary bladder. A small amount of air within the bladder lumen likely related to instrumentation/catheterization. Visualized distal ureters are nondilated. Bowel:  Unremarkable visualized pelvic bowel loops. Vascular/Lymphatic: No pathologically enlarged lymph nodes. No significant vascular abnormality seen. Reproductive: Interval debridement of the previously identified large penile abscess. Air and mixed density material are not present at the base of the penis/perineum, likely reflecting surgical packing material. Additional foci of air within the soft tissues of the penis and left hemiscrotum, favored postoperative. There is a tiny residual 1.5 cm fluid collection within the right perineal soft tissues (series 2, image 48). Other:  No abnormal intrapelvic fluid collection Musculoskeletal: No acute osseous abnormality. No discernible hip joint effusion. IMPRESSION: 1. Interval debridement of penile fluid collection/abscess. Air and mixed density material at the base of the penis/perineum likely reflecting surgical packing material. 2. Additional areas of soft tissue air within the perineum, penis, and left hemiscrotum are favored postoperative. 3. There is a 1.5 cm residual fluid collection within the perineum, right of midline. Electronically Signed   By: Davina Poke M.D.   On: 10/26/2018 11:50    Assessment/Plan:  38 y.o. male s/p abscess debridement  -Continue augmentin abx  - Washout with closure vs WTD vs packing today  - Wean IV pain meds.   - Possible home tomorrow  Dispo: Floor   LOS: 2 days   Tharon Aquas 10/27/2018, 7:47 AM

## 2018-10-28 ENCOUNTER — Encounter (HOSPITAL_COMMUNITY): Payer: Self-pay | Admitting: Urology

## 2018-10-28 LAB — AEROBIC/ANAEROBIC CULTURE W GRAM STAIN (SURGICAL/DEEP WOUND): Culture: NORMAL

## 2018-10-28 LAB — CULTURE, BLOOD (ROUTINE X 2): Culture: NO GROWTH

## 2018-10-28 LAB — HEMOGLOBIN AND HEMATOCRIT, BLOOD
HCT: 27.1 % — ABNORMAL LOW (ref 39.0–52.0)
Hemoglobin: 9.1 g/dL — ABNORMAL LOW (ref 13.0–17.0)

## 2018-10-28 MED ORDER — ACETAMINOPHEN 325 MG PO TABS: 650.0000 mg | ORAL_TABLET | Freq: Three times a day (TID) | ORAL | 0 refills | Status: AC

## 2018-10-28 MED ORDER — HYDROCODONE-ACETAMINOPHEN 5-325 MG PO TABS: 1.0000 | ORAL_TABLET | ORAL | 0 refills | Status: DC | PRN

## 2018-10-28 MED ORDER — AMOXICILLIN-POT CLAVULANATE 875-125 MG PO TABS: 1.0000 | ORAL_TABLET | Freq: Two times a day (BID) | ORAL | 0 refills | Status: AC

## 2018-10-28 NOTE — Discharge Instructions (Signed)
-   Keep suprapubic catheter to drainage.  Urethral catheter capped  -Change gauze/pad underneath Penrose drain 3-4 times per day to keep that area dry  -Okay to shower starting on 10/29/2018  -Continue antibiotics, Augmentin, twice per day until your drain is removed  -We will send a few narcotic pain pills to the pharmacy.  After few days you can take Tylenol and ibuprofen to control the pain.  Ice packs in the area are also okay.  15 minutes on, at least 15 minutes off  -Catheter instructions: You will be taught how to take care of the catheter by the nursing staff prior to discharge from the hospital.  You may periodically feel a strong urge to void with the catheter in place.  This is a bladder spasm and most often can occur when having a bowel movement or moving around. It is typically self-limited and usually will stop after a few minutes.  You may use some Vaseline or Neosporin around the tip of the catheter to reduce friction at the tip of the penis. You may also see some blood in the urine.  A very small amount of blood can make the urine look quite red.  As long as the catheter is draining well, there usually is not a problem.  However, if the catheter is not draining well and is bloody, you should call the office [phone]) to notify us.  Activity: Increase your activity slowly.  Okay for stairs and walking around.  No lifting greater than 10 pounds until drain is removed  -Follow-up: We will call you with an appointment and see you in clinic sometime in about 2 weeks to remove the Penrose drain on the bottom left of the scrotum.  Urethral and suprapubic catheters will need to stay in place for longer

## 2018-10-28 NOTE — Progress Notes (Signed)
Went over discharge papers with patient.  All questions answered.  VSS.  Pt to be discharged with penrose drain, foley tube and supra pubic cath.  Teaching done on tubes and drains.

## 2018-10-28 NOTE — Discharge Summary (Signed)
[facility] Discharge Summary  Admit date: 10/23/2018  Discharge date and time: 10/28/18   Discharge to: Home  Discharge Service: Urology  Discharge Attending Physician:  Lovena Neighbours  Discharge  Diagnoses: Periurethral abscess OR Procedures: Procedure(s): Perineal wound closure 10/27/2018   Ancillary Procedures: None   Discharge Day Services: The patient was seen and examined by the Urology team both in the morning and immediately prior to discharge.  Vital signs and laboratory values were stable and within normal limits.  The physical exam was benign and unchanged and all surgical wounds were examined.  Discharge instructions were explained and all questions answered.  Subjective  No acute events overnight. Pain Controlled. No fever or chills.  Serosanguineous drainage from Penrose.  Urethral catheter draining well  Objective Patient Vitals for the past 8 hrs:  BP Temp Temp src Pulse Resp SpO2  10/28/18 0532 (!) 152/80 99.4 F (37.4 C) Oral 71 16 96 %  10/28/18 0157 136/84 100.2 F (37.9 C) Oral 89 16 96 %   Total I/O In: -  Out: 275 [Urine:275]  General Appearance:        No acute distress Lungs:                       Normal work of breathing on room air Heart:                                Regular rate and rhythm Abdomen:                         Soft, non-tender, non-distended GU:    Suprapubic catheter with clear urine.  Urethral catheter capped.  Penrose with serosanguineous drainage.  Perineal incision healing well, clean dry and intact Extremities:                      Warm and well perfused   Hospital Course:  Jason Oconnell is a 38 y.o. male with a periurethral abscess that was drained on 10/24/18.  Surgery was complicated by considerable bleeding from the left bulbar artery.  He required 2 unit transfusion postoperatively. Underwent repeat debridement/washout on 8/9 with wound vac placement.  Underwent a third trip to the operating room on 10/27/2018 for washout,  minimal debridement, perineal wound closure over a Penrose drain.  From an infection standpoint he did well after the first abscess drainage.  No further fevers.  Was transitioned from IV to oral antibiotics, Augmentin.  Cultures ended up being no growth but were collected after he was given cefepime/vancomycin in the emergency room.  We had several long discussions with the patient regarding the healing and reconstructive process.  He will need the Penrose in place for about 2 weeks to allow residual infection to drain.  We will continue antibiotics during that time.  From a urethral injury standpoint he will need the suprapubic catheter to drainage and the urethral catheter capped for an extended period of time.  Specific follow-up plan for the urethral catheter to be determined but we will likely get a peri-catheter rug at some point prior to removal.  This will be done in clinic.  He was discharged on 10/28/2018, postoperative day 1 from final procedure.  At that time he was tolerating regular diet, had his pain controlled with oral medications, and could ambulate without difficulty.  He was instructed on how to care  for his catheters by the nursing staff.  Follow-up requested for approximately 2 weeks.  We will call him with an appointment date and time   Condition at Discharge: Improved  Discharge Medications:  Allergies as of 10/28/2018   No Known Allergies     Medication List    TAKE these medications   acetaminophen 325 MG tablet Commonly known as: Tylenol Take 2 tablets (650 mg total) by mouth every 8 (eight) hours for 7 days.   amoxicillin-clavulanate 875-125 MG tablet Commonly known as: Augmentin Take 1 tablet by mouth 2 (two) times daily for 14 days.

## 2018-10-29 LAB — CULTURE, BLOOD (ROUTINE X 2): Culture: NO GROWTH

## 2018-11-12 ENCOUNTER — Other Ambulatory Visit: Payer: Self-pay | Admitting: Urology

## 2018-11-26 ENCOUNTER — Encounter (HOSPITAL_BASED_OUTPATIENT_CLINIC_OR_DEPARTMENT_OTHER): Payer: Self-pay | Admitting: *Deleted

## 2018-11-26 ENCOUNTER — Other Ambulatory Visit: Payer: Self-pay

## 2018-11-26 ENCOUNTER — Other Ambulatory Visit (HOSPITAL_COMMUNITY)
Admission: RE | Admit: 2018-11-26 | Discharge: 2018-11-26 | Disposition: A | Discharge status: Home / Self Care | Payer: HRSA Program | Source: Ambulatory Visit | Attending: Urology | Admitting: Urology

## 2018-11-26 DIAGNOSIS — Z20828 Contact with and (suspected) exposure to other viral communicable diseases: Secondary | ICD-10-CM | POA: Insufficient documentation

## 2018-11-26 DIAGNOSIS — Z01812 Encounter for preprocedural laboratory examination: Secondary | ICD-10-CM | POA: Insufficient documentation

## 2018-11-26 LAB — SARS CORONAVIRUS 2 (TAT 6-24 HRS): SARS Coronavirus 2: NEGATIVE

## 2018-11-26 NOTE — Progress Notes (Signed)
Spoke w/ pt via phone for pre-op interview.  Npo after mn w/ exception clear liquids until 0730 then nothing by mouth, pt verbalized understanding.  Arrive at 1145.  Needs hg.  Current ekg in chart and epic. Pt getting covid test done today @ 1455.

## 2018-11-27 ENCOUNTER — Ambulatory Visit (HOSPITAL_BASED_OUTPATIENT_CLINIC_OR_DEPARTMENT_OTHER): Payer: Self-pay | Admitting: Anesthesiology

## 2018-11-27 ENCOUNTER — Other Ambulatory Visit: Payer: Self-pay

## 2018-11-27 ENCOUNTER — Encounter (HOSPITAL_BASED_OUTPATIENT_CLINIC_OR_DEPARTMENT_OTHER)
Admission: RE | Disposition: A | Discharge status: Home / Self Care | Payer: Self-pay | Source: Ambulatory Visit | Attending: Urology

## 2018-11-27 ENCOUNTER — Encounter (HOSPITAL_BASED_OUTPATIENT_CLINIC_OR_DEPARTMENT_OTHER): Payer: Self-pay | Admitting: Emergency Medicine

## 2018-11-27 ENCOUNTER — Ambulatory Visit (HOSPITAL_BASED_OUTPATIENT_CLINIC_OR_DEPARTMENT_OTHER)
Admission: RE | Admit: 2018-11-27 | Discharge: 2018-11-27 | Disposition: A | Discharge status: Home / Self Care | Payer: Self-pay | Source: Ambulatory Visit | Attending: Urology | Admitting: Urology

## 2018-11-27 DIAGNOSIS — F17210 Nicotine dependence, cigarettes, uncomplicated: Secondary | ICD-10-CM | POA: Insufficient documentation

## 2018-11-27 DIAGNOSIS — N340 Urethral abscess: Secondary | ICD-10-CM | POA: Insufficient documentation

## 2018-11-27 DIAGNOSIS — N35819 Other urethral stricture, male, unspecified site: Secondary | ICD-10-CM | POA: Insufficient documentation

## 2018-11-27 HISTORY — DX: Encounter for attention to cystostomy: Z43.5

## 2018-11-27 HISTORY — DX: Presence of other specified devices: Z97.8

## 2018-11-27 HISTORY — DX: Anemia, unspecified: D64.9

## 2018-11-27 HISTORY — DX: Unspecified urethral stricture, male, unspecified site: N35.919

## 2018-11-27 HISTORY — DX: Constipation, unspecified: K59.00

## 2018-11-27 HISTORY — DX: Personal history of other infectious and parasitic diseases: Z86.19

## 2018-11-27 HISTORY — PX: CYSTOSCOPY: SHX5120

## 2018-11-27 LAB — POCT HEMOGLOBIN-HEMACUE: Hemoglobin: 13.2 g/dL (ref 13.0–17.0)

## 2018-11-27 SURGERY — CYSTOSCOPY, FLEXIBLE
Anesthesia: General | Site: Penis

## 2018-11-27 MED ORDER — TRAMADOL HCL 50 MG PO TABS: 50.0000 mg | ORAL_TABLET | Freq: Four times a day (QID) | ORAL | 0 refills | Status: AC | PRN

## 2018-11-27 MED ORDER — MIDAZOLAM HCL 2 MG/2ML IJ SOLN
INTRAMUSCULAR | Status: AC
  Filled 2018-11-27: qty 2

## 2018-11-27 MED ORDER — ONDANSETRON HCL 4 MG/2ML IJ SOLN
4.0000 mg | Freq: Once | INTRAMUSCULAR | Status: DC | PRN
  Filled 2018-11-27: qty 2

## 2018-11-27 MED ORDER — CEFAZOLIN SODIUM-DEXTROSE 2-4 GM/100ML-% IV SOLN
2.0000 g | Freq: Once | INTRAVENOUS | Status: AC
  Administered 2018-11-27: 2 g via INTRAVENOUS
  Filled 2018-11-27: qty 100

## 2018-11-27 MED ORDER — OXYCODONE HCL 5 MG/5ML PO SOLN
5.0000 mg | Freq: Once | ORAL | Status: DC | PRN
  Filled 2018-11-27: qty 5

## 2018-11-27 MED ORDER — DEXAMETHASONE SODIUM PHOSPHATE 10 MG/ML IJ SOLN
INTRAMUSCULAR | Status: AC
  Filled 2018-11-27: qty 1

## 2018-11-27 MED ORDER — PROPOFOL 10 MG/ML IV BOLUS
INTRAVENOUS | Status: DC | PRN
  Administered 2018-11-27: 200 mg via INTRAVENOUS
  Administered 2018-11-27 (×2): 50 mg via INTRAVENOUS
  Administered 2018-11-27: 100 mg via INTRAVENOUS

## 2018-11-27 MED ORDER — FENTANYL CITRATE (PF) 100 MCG/2ML IJ SOLN
INTRAMUSCULAR | Status: AC
  Filled 2018-11-27: qty 2

## 2018-11-27 MED ORDER — OXYCODONE HCL 5 MG PO TABS
5.0000 mg | ORAL_TABLET | Freq: Once | ORAL | Status: DC | PRN
  Filled 2018-11-27: qty 1

## 2018-11-27 MED ORDER — MIDAZOLAM HCL 2 MG/2ML IJ SOLN
INTRAMUSCULAR | Status: DC | PRN
  Administered 2018-11-27: 2 mg via INTRAVENOUS

## 2018-11-27 MED ORDER — LIDOCAINE 2% (20 MG/ML) 5 ML SYRINGE
INTRAMUSCULAR | Status: DC | PRN
  Administered 2018-11-27: 60 mg via INTRAVENOUS

## 2018-11-27 MED ORDER — LIDOCAINE 2% (20 MG/ML) 5 ML SYRINGE
INTRAMUSCULAR | Status: AC
  Filled 2018-11-27: qty 5

## 2018-11-27 MED ORDER — CIPROFLOXACIN HCL 500 MG PO TABS: 500.0000 mg | ORAL_TABLET | Freq: Two times a day (BID) | ORAL | 0 refills | Status: AC

## 2018-11-27 MED ORDER — DEXAMETHASONE SODIUM PHOSPHATE 10 MG/ML IJ SOLN
INTRAMUSCULAR | Status: DC | PRN
  Administered 2018-11-27: 5 mg via INTRAVENOUS

## 2018-11-27 MED ORDER — ACETAMINOPHEN 500 MG PO TABS
ORAL_TABLET | ORAL | Status: AC
  Filled 2018-11-27: qty 2

## 2018-11-27 MED ORDER — PHENAZOPYRIDINE HCL 200 MG PO TABS: 200.0000 mg | ORAL_TABLET | Freq: Three times a day (TID) | ORAL | 0 refills | Status: AC | PRN

## 2018-11-27 MED ORDER — ONDANSETRON HCL 4 MG/2ML IJ SOLN
INTRAMUSCULAR | Status: AC
  Filled 2018-11-27: qty 2

## 2018-11-27 MED ORDER — STERILE WATER FOR IRRIGATION IR SOLN
Status: DC | PRN
  Administered 2018-11-27: 3000 mL via INTRAVESICAL

## 2018-11-27 MED ORDER — CEFAZOLIN SODIUM-DEXTROSE 2-4 GM/100ML-% IV SOLN
INTRAVENOUS | Status: AC
  Filled 2018-11-27: qty 100

## 2018-11-27 MED ORDER — LACTATED RINGERS IV SOLN
INTRAVENOUS | Status: DC
  Administered 2018-11-27: 13:00:00 via INTRAVENOUS
  Filled 2018-11-27: qty 1000

## 2018-11-27 MED ORDER — FENTANYL CITRATE (PF) 100 MCG/2ML IJ SOLN
INTRAMUSCULAR | Status: DC | PRN
  Administered 2018-11-27 (×2): 50 ug via INTRAVENOUS
  Administered 2018-11-27: 100 ug via INTRAVENOUS

## 2018-11-27 MED ORDER — ONDANSETRON HCL 4 MG/2ML IJ SOLN
INTRAMUSCULAR | Status: DC | PRN
  Administered 2018-11-27: 4 mg via INTRAVENOUS

## 2018-11-27 MED ORDER — FENTANYL CITRATE (PF) 100 MCG/2ML IJ SOLN
25.0000 ug | INTRAMUSCULAR | Status: DC | PRN
  Administered 2018-11-27 (×2): 50 ug via INTRAVENOUS
  Filled 2018-11-27: qty 1

## 2018-11-27 MED ORDER — KETOROLAC TROMETHAMINE 30 MG/ML IJ SOLN
INTRAMUSCULAR | Status: AC
  Filled 2018-11-27: qty 1

## 2018-11-27 SURGICAL SUPPLY — 21 items
BAG DRAIN URO-CYSTO SKYTR STRL (DRAIN) ×3 IMPLANT
CATH FOLEY 2W COUNCIL 5CC 18FR (CATHETERS) ×3 IMPLANT
CATH INTERMIT  6FR 70CM (CATHETERS) IMPLANT
CATH URET 5FR 28IN CONE TIP (BALLOONS)
CATH URET 5FR 28IN OPEN ENDED (CATHETERS) IMPLANT
CATH URET 5FR 70CM CONE TIP (BALLOONS) IMPLANT
CLOTH BEACON ORANGE TIMEOUT ST (SAFETY) ×6 IMPLANT
DRSG OPSITE POSTOP 3X4 (GAUZE/BANDAGES/DRESSINGS) ×3 IMPLANT
GLOVE BIO SURGEON STRL SZ8 (GLOVE) ×3 IMPLANT
GOWN STRL REUS W/ TWL XL LVL3 (GOWN DISPOSABLE) ×1 IMPLANT
GOWN STRL REUS W/TWL XL LVL3 (GOWN DISPOSABLE) ×2
GUIDEWIRE ANG ZIPWIRE 038X150 (WIRE) ×3 IMPLANT
GUIDEWIRE STR DUAL SENSOR (WIRE) IMPLANT
KIT TURNOVER CYSTO (KITS) ×3 IMPLANT
MANIFOLD NEPTUNE II (INSTRUMENTS) ×3 IMPLANT
NS IRRIG 500ML POUR BTL (IV SOLUTION) IMPLANT
PACK CYSTO (CUSTOM PROCEDURE TRAY) ×3 IMPLANT
SUT CHROMIC 3 0 SH 27 (SUTURE) ×3 IMPLANT
TUBE CONNECTING 12'X1/4 (SUCTIONS) ×1
TUBE CONNECTING 12X1/4 (SUCTIONS) ×2 IMPLANT
WATER STERILE IRR 3000ML UROMA (IV SOLUTION) ×6 IMPLANT

## 2018-11-27 NOTE — H&P (Addendum)
PRE-OP H&P  CC/HPI: Peri-urethral abscess   Jason Oconnell is a 38 y.o. male with a periurethral abscess and sepsis requiring draining on 10/24/18.  Surgery was complicated by considerable bleeding from the left bulbar artery. He required 2 unit transfusion postoperatively. Underwent repeat debridement/washout on 8/9 with wound vac placement. Underwent a third trip to the operating room on 10/27/2018 for washout, minimal debridement, perineal wound closure over a Penrose drain.   The patient is here today for routine follow-up. His Penrose drain is since been removed. The patient reports perineal discomfort that has progressively improved since discharge and removal of his Penrose drain. He reports a small amount of serosanguineous drainage from the incision that the Penrose drain was exiting from. His suprapubic catheter is draining without any issues. He does state that he occasionally will leak some urine around his urethral catheter. Overall, he is doing well and denies interval fever/chills or general malaise.     ALLERGIES: None   MEDICATIONS: Hydrocodone     GU PSH: None   NON-GU PSH: None   GU PMH: Urethral abscess - 11/03/2018    NON-GU PMH: None   FAMILY HISTORY: 2 daughters - Daughter 1 son - Son father living - Father mother living - Mother   SOCIAL HISTORY: Marital Status: Single Current Smoking Status: Patient smokes. Has smoked since 10/16/2005. Smokes 1 pack per day.   Tobacco Use Assessment Completed: Used Tobacco in last 30 days? Has never drank.  Drinks 2 caffeinated drinks per day. Patient's occupation is/was unemployed.    REVIEW OF SYSTEMS:    GU Review Male:   Patient denies frequent urination, hard to postpone urination, burning/ pain with urination, get up at night to urinate, leakage of urine, stream starts and stops, trouble starting your stream, have to strain to urinate , erection problems, and penile pain.  Gastrointestinal (Upper):   Patient denies  nausea, vomiting, and indigestion/ heartburn.  Gastrointestinal (Lower):   Patient denies diarrhea and constipation.  Constitutional:   Patient denies fever, night sweats, weight loss, and fatigue.  Skin:   Patient denies skin rash/ lesion and itching.  Eyes:   Patient denies blurred vision and double vision.  Ears/ Nose/ Throat:   Patient denies sore throat and sinus problems.  Hematologic/Lymphatic:   Patient denies swollen glands and easy bruising.  Cardiovascular:   Patient denies leg swelling and chest pains.  Respiratory:   Patient denies cough and shortness of breath.  Endocrine:   Patient denies excessive thirst.  Musculoskeletal:   Patient denies back pain and joint pain.  Neurological:   Patient denies headaches and dizziness.  Psychologic:   Patient denies depression and anxiety.   Notes: Indweling catheter in place.     VITAL SIGNS:      11/12/2018 09:22 AM  Weight 215 lb / 97.52 kg  Height 72 in / 182.88 cm  BP 129/86 mmHg  Pulse 90 /min  Temperature 97.7 F / 36.5 C  BMI 29.2 kg/m   GU PHYSICAL EXAMINATION:    Anus and Perineum: No hemorrhoids. No anal stenosis. No rectal fissure, no anal fissure. The perineal incision is clean, dry and intact with no evidence of cellulitis or surrounding erythema. The leftward side of the perineum is mildly tender to palpation with no evidence of induration or fluctuant fluid collection. There was only scant drainage from his Penrose incision following palpation of the perineum.  Scrotum: No lesions. No edema. No cysts. No warts.  Epididymides: Right: no spermatocele, no  masses, no cysts, no tenderness, no induration, no enlargement. Left: no spermatocele, no masses, no cysts, no tenderness, no induration, no enlargement.  Testes: No tenderness, no swelling, no enlargement left testes. No tenderness, no swelling, no enlargement right testes. Normal location left testes. Normal location right testes. No mass, no cyst, no varicocele, no  hydrocele left testes. No mass, no cyst, no varicocele, no hydrocele right testes.  Urethral Meatus: Normal size. No lesion, no wart, no discharge, no polyp. Normal location.  Penis: Circumcised, no warts, no cracks. No dorsal Peyronie's plaques, no left corporal Peyronie's plaques, no right corporal Peyronie's plaques, no scarring, no warts. No balanitis, no meatal stenosis.   Notes: 67F Suprapubic catheter in place and draining clear-yellow urine. His urethral catheter is capped.   MULTI-SYSTEM PHYSICAL EXAMINATION:    Constitutional: Well-nourished. No physical deformities. Normally developed. Good grooming.  Neck: Neck symmetrical, not swollen. Normal tracheal position.  Respiratory: No labored breathing, no use of accessory muscles.   Cardiovascular: Normal temperature, normal extremity pulses, no swelling, no varicosities.  Lymphatic: No enlargement of neck, axillae, groin.  Skin: No paleness, no jaundice, no cyanosis. No lesion, no ulcer, no rash.  Neurologic / Psychiatric: Oriented to time, oriented to place, oriented to person. No depression, no anxiety, no agitation.  Gastrointestinal: No mass, no tenderness, no rigidity, non obese abdomen.  Eyes: Normal conjunctivae. Normal eyelids.  Musculoskeletal: Normal gait and station of head and neck.     PAST DATA REVIEWED:  Source Of History:  Patient   PROCEDURES:          Urinalysis w/Scope Dipstick Dipstick Cont'd Micro  Color: Amber Bilirubin: Neg mg/dL WBC/hpf: 6 - 25/ZDG10/hpf  Appearance: Cloudy Ketones: Neg mg/dL RBC/hpf: >38/VFI>60/hpf  Specific Gravity: 1.025 Blood: 3+ ery/uL Bacteria: Many (>50/hpf)  pH: 6.0 Protein: 3+ mg/dL Cystals: NS (Not Seen)  Glucose: Neg mg/dL Urobilinogen: 1.0 mg/dL Casts: NS (Not Seen)    Nitrites: Neg Trichomonas: Not Present    Leukocyte Esterase: 1+ leu/uL Mucous: Present      Epithelial Cells: 0 - 5/hpf      Yeast: NS (Not Seen)      Sperm: Not Present    ASSESSMENT:      ICD-10 Details  1 GU:    Urethral abscess - N34.0 Status post I&D of 9 cm periurethral abscess with concomitant proximal urethral defect    PLAN:            Medications New Meds: Tramadol Hcl 50 mg tablet 1 tablet PO Q 4 H PRN   #30  0 Refill(s)            Schedule Return Visit/Planned Activity: 2 Weeks - Schedule Surgery          Document Letter(s):  Created for Patient: Clinical Summary         Notes:   I had a long discussion with the patient concerning the severity of his periurethral abscess as well as his urethral defect. The plan moving forward is to schedule the patient for a cystoscopy with exam under anesthesia to assess his urethra and potentially remove his urethral catheter. I will exchange his suprapubic tube at that time. [facility] for a possible urethroplasty, which was explained to the patient at length. He voices understanding and wishes to proceed.

## 2018-11-27 NOTE — Discharge Instructions (Signed)

## 2018-11-27 NOTE — Op Note (Signed)
Operative Note  Preoperative diagnosis:  1.  History of periurethral abscess with multiple urethral defects following incision and drainage  Postoperative diagnosis: 1.  1 cm membranous urethral defect from retained Vicryl suture 2.  Near complete mucosal recovery of his prior membranous urethral defects  Procedure(s): 1.  Cystoscopy with exam under anesthesia, removal of foreign body, Foley catheter exchange and suprapubic tube removal  Surgeon: Ellison Hughs, MD  Assistants:  None  Anesthesia:  General  Complications:  None  EBL: Less than 5 mL  Specimens: 1.  None  Drains/Catheters: 1.  33 French council tip catheter with 10 mL in the balloon  Intraoperative findings:   1. The previous urethral defect was almost completely healed aside from a 0.5 to 1.0 cm defect that had a knotted Vicryl suture within it.  There was no fluctuance or signs of infection involving the perineum on physical exam.  No intravesical pathology was noted on cystoscopy.  Indication:  Jason Oconnell is a 38 y.o. male with a history of a periurethral abscess and urosepsis requiring incision and drainage on 10/24/2018.  His surgery was complicated by considerable amount of bleeding from the left bulbar artery along with near complete obliteration of a significant portion of his membranous urethra from infection.  He underwent repeat debridement and washout on 10/25/2018 with wound VAC placement.  He subsequently underwent an additional third surgery on 10/27/2018 for washout, debridement and perineal wound closure.  He had a suprapubic tube and urethral catheter placed at the time of his initial operation.  He is here today for a cystoscopy with exam under anesthesia.  He has been consented for the above procedures, voices understanding and wishes to proceed.  Description of procedure:  After informed consent was obtained, the patient was brought to the operating room and general LMA anesthesia was  administered. The patient was then placed in the dorsolithotomy position and prepped and draped in the usual sterile fashion. A timeout was performed. A 23 French rigid cystoscope was then inserted into the urethral meatus and advanced into the proximal portion of the urethra where I immediately identified a small knotted Vicryl suture.  Otherwise, his urethral defect had almost completely healed and exhibited excellent mucosal recovery.    The cystoscope was then advanced into the bladder and a complete bladder survey revealed no intravesical pathology.  His suprapubic tube was in good position.  I then used cystoscopic scissors to cut the Vicryl suture and remove it atraumatically.  A zip wire was then inserted into the bladder under direct vision and the cystoscope was removed, leaving the wire in place.  An 74 [place] council tip catheter was then advanced over the wire and into position within the bladder.  The balloon of the catheter was inflated with 10 mL of sterile water.    Given the excellent mucosal recovery of his urethral defect, I made the decision to remove his suprapubic catheter and keep his urethral catheter in place for an additional 2 weeks.  His perineal wound has completely healed and showed no signs of fluctuance or superficial infection.  His suprapubic catheter tract was reapproximated using an interrupted 3-0 chromic suture and dressed appropriately.  He tolerated the procedure well and was transferred to the postanesthesia in stable condition.  Plan: Keep Foley catheter in place for an additional 2 weeks.  I will have him follow-up for a flexible cystoscopy in the office.

## 2018-11-27 NOTE — Anesthesia Preprocedure Evaluation (Addendum)
Anesthesia Evaluation  Patient identified by MRN, date of birth, ID band Patient awake    Reviewed: Allergy & Precautions, NPO status , Patient's Chart, lab work & pertinent test results  History of Anesthesia Complications Negative for: history of anesthetic complications  Airway Mallampati: II  TM Distance: >3 FB Neck ROM: Full    Dental  (+) Poor Dentition   Pulmonary Current Smoker,    Pulmonary exam normal        Cardiovascular negative cardio ROS Normal cardiovascular exam     Neuro/Psych negative neurological ROS  negative psych ROS   GI/Hepatic negative GI ROS, Neg liver ROS,   Endo/Other  negative endocrine ROS  Renal/GU negative Renal ROS   S/P periurethral Abscess w/ debridement x 2 Suprapubic catheter in place. negative genitourinary   Musculoskeletal negative musculoskeletal ROS (+)   Abdominal   Peds  Hematology negative hematology ROS (+)   Anesthesia Other Findings   Reproductive/Obstetrics                          Anesthesia Physical Anesthesia Plan  ASA: II  Anesthesia Plan: General   Post-op Pain Management:    Induction: Intravenous  PONV Risk Score and Plan: 1 and Ondansetron, Dexamethasone, Treatment may vary due to age or medical condition and Midazolam  Airway Management Planned: LMA  Additional Equipment: None  Intra-op Plan:   Post-operative Plan: Extubation in OR  Informed Consent: I have reviewed the patients History and Physical, chart, labs and discussed the procedure including the risks, benefits and alternatives for the proposed anesthesia with the patient or authorized representative who has indicated his/her understanding and acceptance.     Dental advisory given  Plan Discussed with:   Anesthesia Plan Comments:        Anesthesia Quick Evaluation

## 2018-11-27 NOTE — Transfer of Care (Signed)
Immediate Anesthesia Transfer of Care Note  Patient: Jason Oconnell  Procedure(s) Performed: CYSTOSCOPY FLEXIBLE WITH EXAM UNDER ANESTHESIA, FOLEY CAB\THETER EXCHANGE AND REMOVAL OF SUPRAPUBIC CATHETER (N/A Penis)  Patient Location: PACU  Anesthesia Type:General  Level of Consciousness: awake, alert , oriented and patient cooperative  Airway & Oxygen Therapy: Patient Spontanous Breathing and Patient connected to nasal cannula oxygen  Post-op Assessment: Report given to RN and Post -op Vital signs reviewed and stable  Post vital signs: Reviewed and stable  Last Vitals:  Vitals Value Taken Time  BP    Temp    Pulse 97 11/27/18 1448  Resp 23 11/27/18 1448  SpO2 100 % 11/27/18 1448  Vitals shown include unvalidated device data.  Last Pain:  Vitals:   11/27/18 1228  TempSrc: Oral  PainSc: 0-No pain      Patients Stated Pain Goal: 5 (75/91/63 [id number])  Complications: No apparent anesthesia complications

## 2018-11-27 NOTE — Anesthesia Procedure Notes (Signed)
Procedure Name: LMA Insertion Date/Time: 11/27/2018 1:57 PM Performed by: Wanita Chamberlain, CRNA Pre-anesthesia Checklist: Patient identified, Emergency Drugs available, Suction available, Patient being monitored and Timeout performed Patient Re-evaluated:Patient Re-evaluated prior to induction Oxygen Delivery Method: Circle system utilized Preoxygenation: Pre-oxygenation with 100% oxygen Induction Type: IV induction Ventilation: Mask ventilation without difficulty LMA: LMA inserted LMA Size: 4.0 Number of attempts: 1 Placement Confirmation: breath sounds checked- equal and bilateral,  CO2 detector and positive ETCO2 Tube secured with: Tape Dental Injury: Teeth and Oropharynx as per pre-operative assessment

## 2018-11-30 ENCOUNTER — Encounter (HOSPITAL_BASED_OUTPATIENT_CLINIC_OR_DEPARTMENT_OTHER): Payer: Self-pay | Admitting: Urology

## 2018-11-30 NOTE — Anesthesia Postprocedure Evaluation (Signed)
Anesthesia Post Note  Patient: Jason Oconnell  Procedure(s) Performed: CYSTOSCOPY FLEXIBLE WITH EXAM UNDER ANESTHESIA, FOLEY CAB\THETER EXCHANGE AND REMOVAL OF SUPRAPUBIC CATHETER (N/A Penis)     Patient location during evaluation: PACU Anesthesia Type: General Level of consciousness: awake Pain management: pain level controlled Vital Signs Assessment: post-procedure vital signs reviewed and stable Respiratory status: spontaneous breathing Cardiovascular status: stable Postop Assessment: no apparent nausea or vomiting Anesthetic complications: no    Last Vitals:  Vitals:   11/27/18 1530 11/27/18 1607  BP: (!) 146/94 (!) 142/90  Pulse: 73 63  Resp: 16 16  Temp: 37.2 C 36.7 C  SpO2: 97% 100%    Last Pain:  Vitals:   11/27/18 1607  TempSrc: Oral  PainSc: 0-No pain   Pain Goal: Patients Stated Pain Goal: 5 (11/27/18 1530)                 Huston Foley

## 2020-01-21 IMAGING — CT CT PELVIS WITH CONTRAST
2 of 3 series · 17 of 46 positions shown, 19 images · IV contrast (APPLIED)
Comparison: CT 10/24/2018

CLINICAL DATA: Penal abscess debridement.  Persistent pain.

EXAM:
CT PELVIS WITH CONTRAST
TECHNIQUE: Multidetector CT imaging of the pelvis was performed using the
standard protocol following the bolus administration of intravenous
contrast.
CONTRAST:  100mL OMNIPAQUE IOHEXOL 300 MG/ML  SOLN

[Series 2: axial st · axial · 0.98mm/px · z∈[+998,+1353]mm · 14 of 83 slices shown, 16 images]
[im 6/83  soft-tissue]
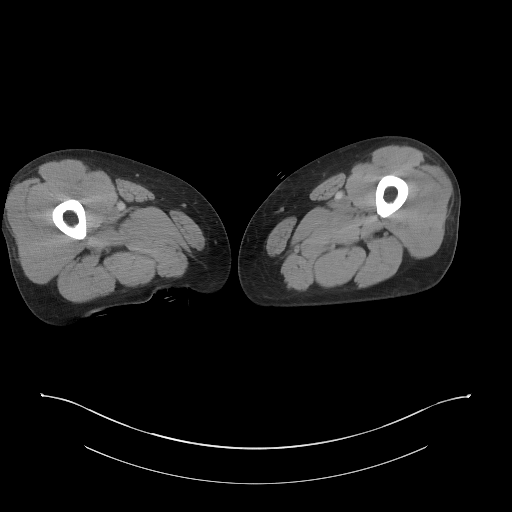
[im 6/83  bone]
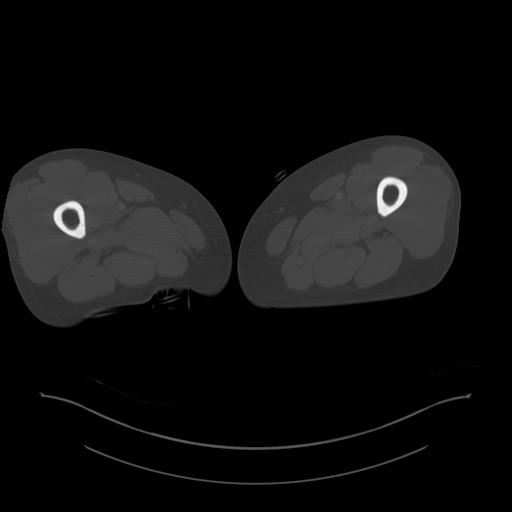
[im 11/83  soft-tissue]
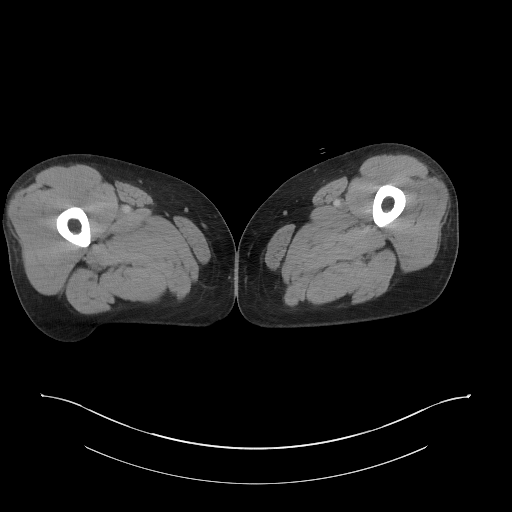
[im 16/83  soft-tissue]
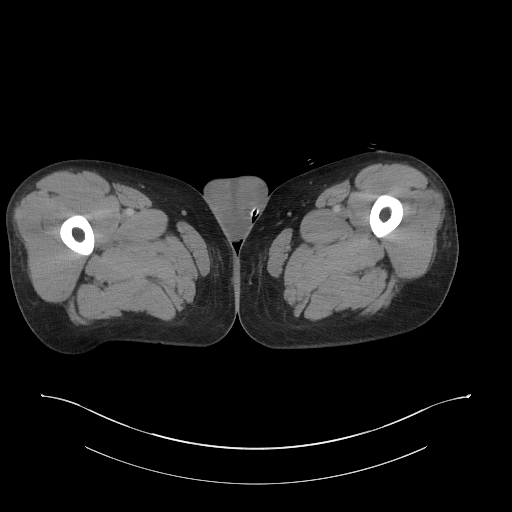
[im 22/83  soft-tissue]
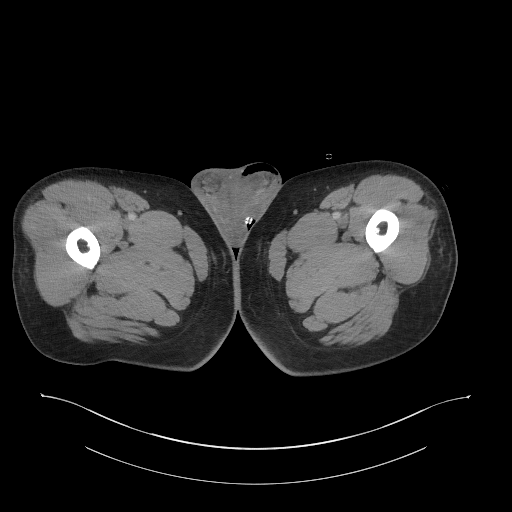
[im 27/83  soft-tissue]
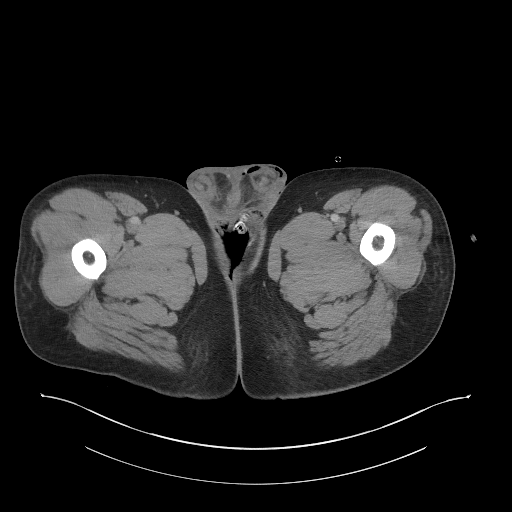
[im 32/83  soft-tissue]
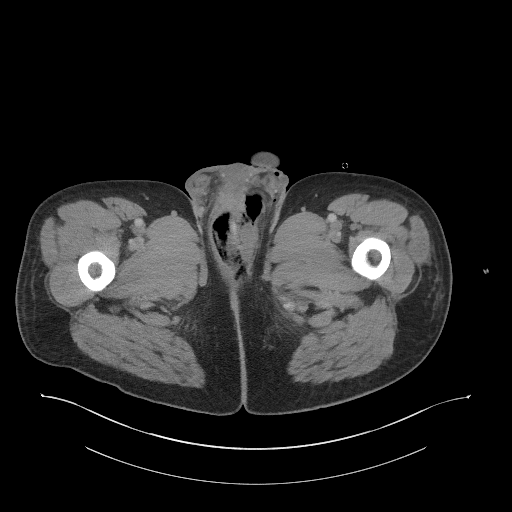
[im 38/83  soft-tissue]
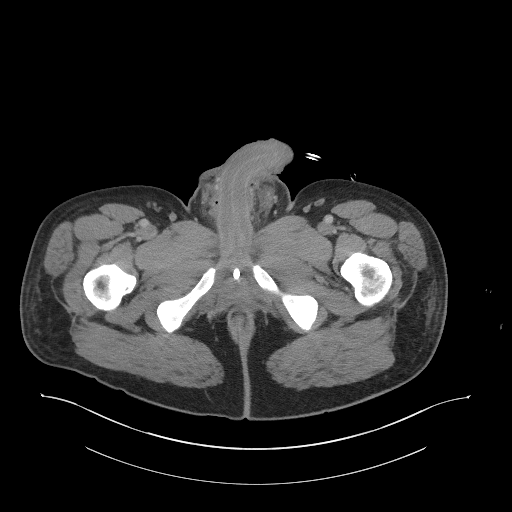
[im 45/83  soft-tissue]
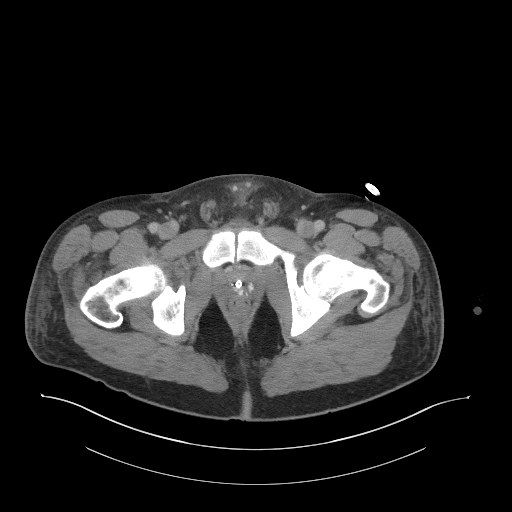
[im 51/83  soft-tissue]
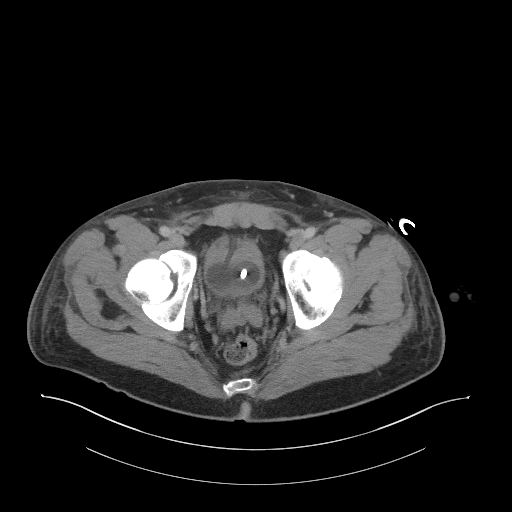
[im 51/83  bone]
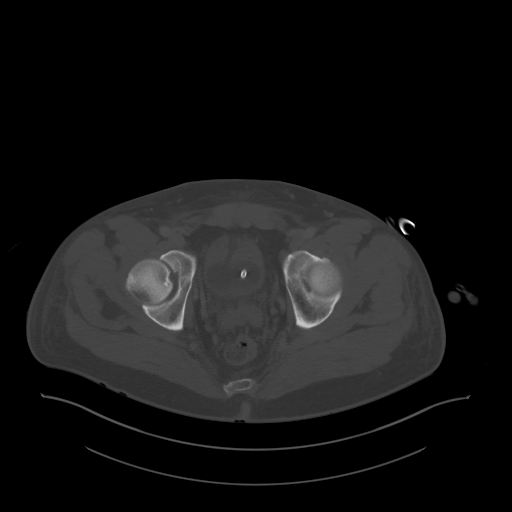
[im 56/83  soft-tissue]
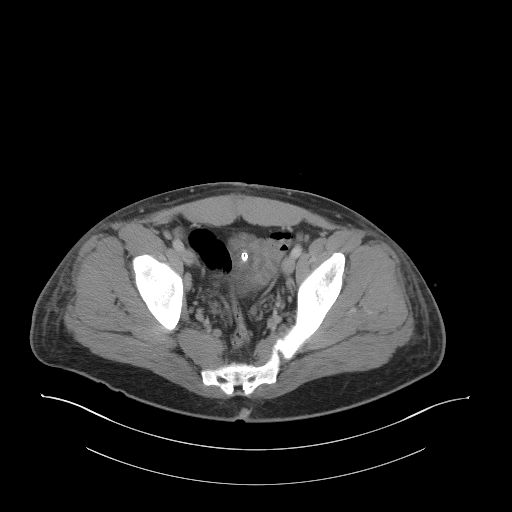
[im 61/83  soft-tissue]
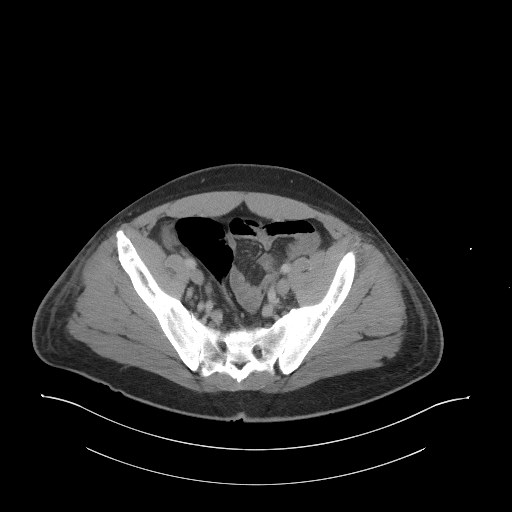
[im 67/83  soft-tissue]
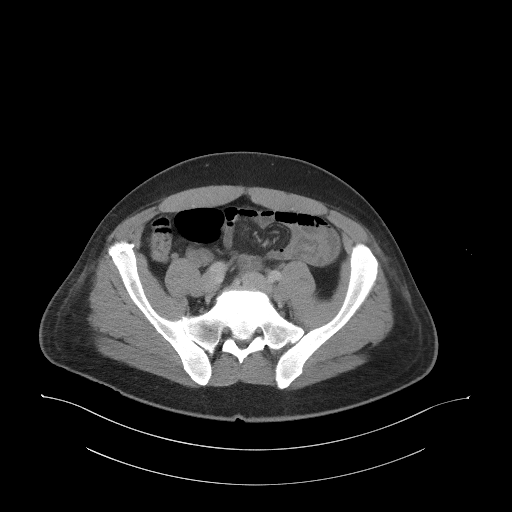
[im 72/83  soft-tissue]
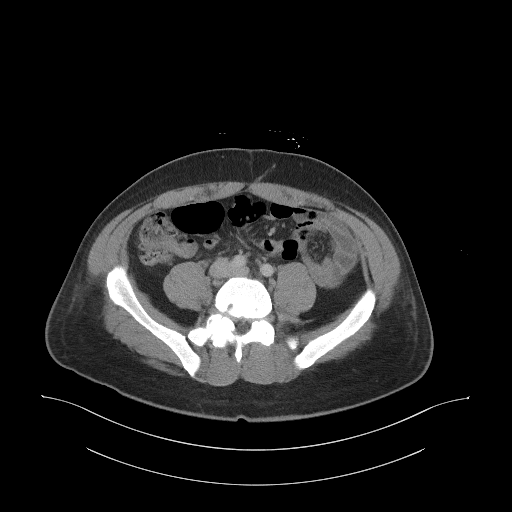
[im 77/83  soft-tissue]
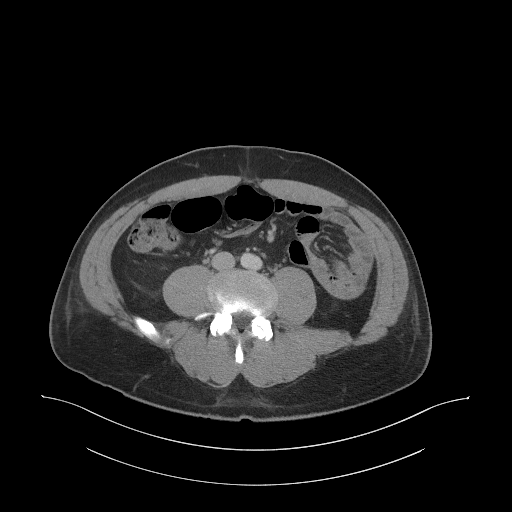

[Series 4: coronal st · coronal · 0.81mm/px · 3 of 97 slices shown]
[im 33/97  soft-tissue]
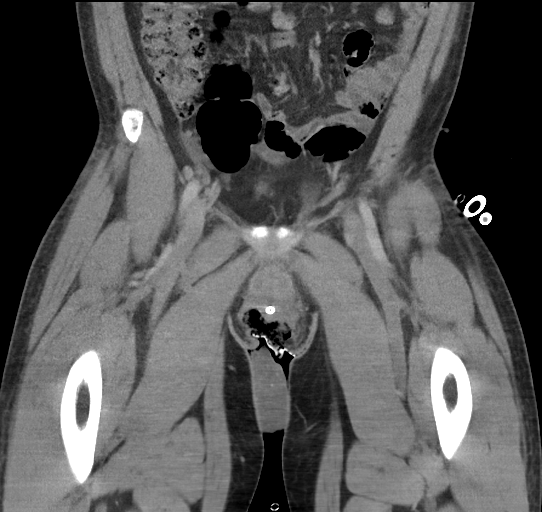
[im 43/97  soft-tissue]
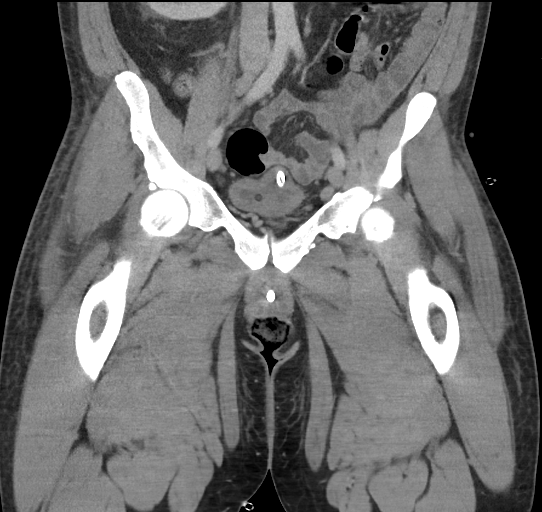
[im 54/97  soft-tissue]
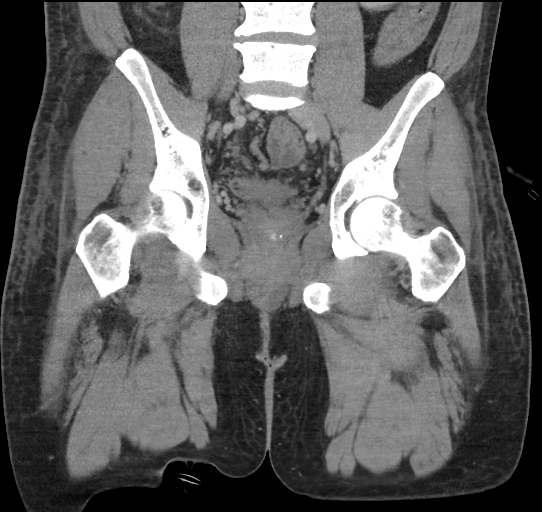

[17 of 46 positions shown; findings below may reference images not displayed]

FINDINGS: Urinary Tract: There is a Foley catheter and suprapubic catheter
positioned within the urinary bladder. A small amount of air within
the bladder lumen likely related to instrumentation/catheterization.
Visualized distal ureters are nondilated.

Bowel:  Unremarkable visualized pelvic bowel loops.

Vascular/Lymphatic: No pathologically enlarged lymph nodes. No
significant vascular abnormality seen.

Reproductive: Interval debridement of the previously identified
large penile abscess. Air and mixed density material are not present
at the base of the penis/perineum, likely reflecting surgical
packing material. Additional foci of air within the soft tissues of
the penis and left hemiscrotum, favored postoperative. There is a
tiny residual 1.5 cm fluid collection within the right perineal soft
tissues (series 2, image 48).

Other:  No abnormal intrapelvic fluid collection

Musculoskeletal: No acute osseous abnormality. No discernible hip
joint effusion.
IMPRESSION: 1. Interval debridement of penile fluid collection/abscess. Air and
mixed density material at the base of the penis/perineum likely
reflecting surgical packing material.
2. Additional areas of soft tissue air within the perineum, penis,
and left hemiscrotum are favored postoperative.
3. There is a 1.5 cm residual fluid collection within the perineum,
right of midline.
